# Patient Record
Sex: Female | Born: 1990
Health system: Southern US, Community
[De-identification: ages and names within clinical notes are randomized; demographics above are authoritative.]

## PROBLEM LIST (undated history)

## (undated) DIAGNOSIS — F419 Anxiety disorder, unspecified: Secondary | ICD-10-CM

## (undated) DIAGNOSIS — J45909 Unspecified asthma, uncomplicated: Secondary | ICD-10-CM

## (undated) HISTORY — DX: Unspecified asthma, uncomplicated: J45.909

## (undated) HISTORY — PX: TONSILLECTOMY: SHX5217

## (undated) HISTORY — DX: Anxiety disorder, unspecified: F41.9

---
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
## 2011-06-16 ENCOUNTER — Ambulatory Visit (INDEPENDENT_AMBULATORY_CARE_PROVIDER_SITE_OTHER): Payer: 59 | Admitting: Family Medicine

## 2011-06-16 ENCOUNTER — Encounter: Payer: Self-pay | Admitting: Family Medicine

## 2011-06-16 ENCOUNTER — Other Ambulatory Visit (HOSPITAL_COMMUNITY)
Admission: RE | Admit: 2011-06-16 | Discharge: 2011-06-16 | Disposition: A | Discharge status: Home / Self Care | Payer: 59 | Source: Ambulatory Visit | Attending: Family Medicine | Admitting: Family Medicine

## 2011-06-16 DIAGNOSIS — N938 Other specified abnormal uterine and vaginal bleeding: Secondary | ICD-10-CM

## 2011-06-16 DIAGNOSIS — J45901 Unspecified asthma with (acute) exacerbation: Secondary | ICD-10-CM

## 2011-06-16 DIAGNOSIS — N949 Unspecified condition associated with female genital organs and menstrual cycle: Secondary | ICD-10-CM

## 2011-06-16 DIAGNOSIS — IMO0001 Reserved for inherently not codable concepts without codable children: Secondary | ICD-10-CM

## 2011-06-16 DIAGNOSIS — Z23 Encounter for immunization: Secondary | ICD-10-CM

## 2011-06-16 DIAGNOSIS — J45909 Unspecified asthma, uncomplicated: Secondary | ICD-10-CM

## 2011-06-16 DIAGNOSIS — Z01419 Encounter for gynecological examination (general) (routine) without abnormal findings: Secondary | ICD-10-CM | POA: Insufficient documentation

## 2011-06-16 DIAGNOSIS — R5383 Other fatigue: Secondary | ICD-10-CM

## 2011-06-16 LAB — POCT HEMOGLOBIN: Hemoglobin: 14

## 2011-06-16 MED ORDER — ETHYNODIOL DIAC-ETH ESTRADIOL 1-50 MG-MCG PO TABS: 1.0000 | ORAL_TABLET | Freq: Every day | ORAL | Status: DC

## 2011-06-16 MED ORDER — PREDNISONE 20 MG PO TABS: ORAL_TABLET | ORAL | Status: DC

## 2011-06-16 NOTE — Patient Instructions (Signed)
Take 2  prednisone tablets daily for 3 days, then taper as outlined  Start the [facility] with you next.  Return in two weeks for follow-up

## 2011-07-01 ENCOUNTER — Encounter: Payer: Self-pay | Admitting: Family Medicine

## 2011-07-01 DIAGNOSIS — R5383 Other fatigue: Secondary | ICD-10-CM | POA: Insufficient documentation

## 2011-07-01 DIAGNOSIS — J45909 Unspecified asthma, uncomplicated: Secondary | ICD-10-CM | POA: Insufficient documentation

## 2011-07-01 DIAGNOSIS — N938 Other specified abnormal uterine and vaginal bleeding: Secondary | ICD-10-CM | POA: Insufficient documentation

## 2011-07-01 NOTE — Progress Notes (Signed)
  Subjective:    Patient ID: Erin Pratt, female    DOB: Feb 01, 1991, 20 y.o.   MRN: [id number]  HPI Erin Pratt is a 20 year old single female, nonsmoker, who comes in today for evaluation of fatigue.  She has a history of feeling tired no energy for the past couple weeks, etiology unknown.  She went recently to an urgent care for evaluation of cough and was given a Z-Pak, but it didn't help.  She's had a history of allergic rhinitis in the past.  Her periods are heavy and a lot of cramping.  Currently no birth control.  Not sexually active.  She said surgery on her elbow and shoulder previous softball injuries.  When she was a teenager.   Review of Systems  Constitutional: Positive for fatigue.  HENT: Negative.   Eyes: Negative.   Respiratory: Positive for cough.   Cardiovascular: Negative.   Gastrointestinal: Negative.   Genitourinary: Negative.   Musculoskeletal: Negative.   Neurological: Negative.   Hematological: Negative.   Psychiatric/Behavioral: Negative.        Objective:   Physical Exam  Constitutional: She appears well-developed and well-nourished.  HENT:  Head: Normocephalic and atraumatic.  Right Ear: External ear normal.  Left Ear: External ear normal.  Nose: Nose normal.  Mouth/Throat: Oropharynx is clear and moist.  Eyes: EOM are normal. Pupils are equal, round, and reactive to light.  Neck: Normal range of motion. Neck supple. No thyromegaly present.  Cardiovascular: Normal rate, regular rhythm, normal heart sounds and intact distal pulses.  Exam reveals no gallop and no friction rub.   No murmur heard. Pulmonary/Chest: Effort normal. She has wheezes.  Abdominal: Soft. Bowel sounds are normal. She exhibits no distension and no mass. There is no tenderness. There is no rebound.  Genitourinary: Vagina normal and uterus normal. No vaginal discharge found.       Bilateral breast exam normal  Musculoskeletal: Normal range of motion.  Lymphadenopathy:   She has no cervical adenopathy.  Neurological: She is alert. She has normal reflexes. No cranial nerve deficit. She exhibits normal muscle tone. Coordination normal.  Skin: Skin is warm and dry.  Psychiatric: She has a normal mood and affect. Her behavior is normal. Judgment and thought content normal.          Assessment & Plan:  Healthy female.  Dysfunctional uterine bleeding.  Start BCPs.  Asthma.  Begin prednisone burst and taper.  Return in two weeks for follow-up, sooner if any problem

## 2011-07-02 ENCOUNTER — Ambulatory Visit: Payer: 59 | Admitting: Family Medicine

## 2011-07-07 ENCOUNTER — Telehealth: Payer: Self-pay | Admitting: *Deleted

## 2011-07-07 MED ORDER — AZITHROMYCIN 250 MG PO TABS: ORAL_TABLET | ORAL | Status: AC

## 2011-07-07 NOTE — Telephone Encounter (Signed)
Patient was exposed to pertussis. Positive test.  z pack per dr Kirtland Bouchard

## 2011-07-14 ENCOUNTER — Ambulatory Visit: Payer: 59 | Admitting: Family Medicine

## 2011-07-24 ENCOUNTER — Ambulatory Visit (INDEPENDENT_AMBULATORY_CARE_PROVIDER_SITE_OTHER): Payer: 59 | Admitting: Family Medicine

## 2011-07-24 ENCOUNTER — Encounter: Payer: Self-pay | Admitting: Family Medicine

## 2011-07-24 DIAGNOSIS — J45909 Unspecified asthma, uncomplicated: Secondary | ICD-10-CM

## 2011-07-24 DIAGNOSIS — N949 Unspecified condition associated with female genital organs and menstrual cycle: Secondary | ICD-10-CM

## 2011-07-24 DIAGNOSIS — N938 Other specified abnormal uterine and vaginal bleeding: Secondary | ICD-10-CM

## 2011-07-24 NOTE — Patient Instructions (Signed)
Return in October for a complete physical examination and Pap sooner if any problems

## 2011-07-24 NOTE — Progress Notes (Signed)
  Subjective:    Patient ID: Erin Pratt, female    DOB: 1991/05/21, 20 y.o.   MRN: [id number]  HPI Erin Pratt is a 20 year old single female, nonsmoker, who comes in today for evaluation of two problems.  We saw her for a flare of her asthma and started on a short course of prednisone.  She now is symptom free.  We started her on BCPs because of dysfunction uterine bleeding.  She is taking one pack period started this week.  No side effects from medication, specifically, no breast changes.  Blood pressure, normal.   Review of Systems General and pulmonary and GYN review of systems otherwise negative    Objective:   Physical Exam  Well-built well-nourished, female, in no acute distress.  Examination lungs were perfectly normal.  No wheezing      Assessment & Plan:  Dysfunction uterine bleeding.  Continue BCPs follow-up in one year.  Asthma, resolved.  Return p.r.n.

## 2011-10-01 ENCOUNTER — Ambulatory Visit (INDEPENDENT_AMBULATORY_CARE_PROVIDER_SITE_OTHER): Payer: 59 | Admitting: Family Medicine

## 2011-10-01 ENCOUNTER — Encounter: Payer: Self-pay | Admitting: Family Medicine

## 2011-10-01 DIAGNOSIS — L509 Urticaria, unspecified: Secondary | ICD-10-CM | POA: Insufficient documentation

## 2011-10-01 MED ORDER — PREDNISONE 20 MG PO TABS: ORAL_TABLET | ORAL | Status: DC

## 2011-10-01 NOTE — Patient Instructions (Signed)
Take the prednisone as directed  Claritin 10 mg in the morning for itching  Return when necessary

## 2011-10-01 NOTE — Progress Notes (Signed)
  Subjective:    Patient ID: Erin Pratt, female    DOB: Jan 04, 1991, 21 y.o.   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 She has a history of underlying allergic rhinitis and asthma. She's never had urticaria before.  Review of diet shows no change  No history of any contact dermatitis   Review of Systems    general and dermatologic review of systems otherwise negative Objective:   Physical Exam Well-developed well-nourished female in no acute distress examination skin shows for whelps 2" x 2" right upper and right wrist and left arm.       Assessment & Plan:  Urticaria unknown etiology plan prednisone burst and taper return when necessary

## 2011-10-21 ENCOUNTER — Telehealth: Payer: Self-pay | Admitting: Family Medicine

## 2011-10-21 ENCOUNTER — Encounter: Payer: Self-pay | Admitting: Family Medicine

## 2011-10-21 ENCOUNTER — Ambulatory Visit (INDEPENDENT_AMBULATORY_CARE_PROVIDER_SITE_OTHER): Payer: 59 | Admitting: Family Medicine

## 2011-10-21 VITALS — BP 110/70 | Temp 99.0°F | Wt 149.0 lb

## 2011-10-21 DIAGNOSIS — G43919 Migraine, unspecified, intractable, without status migrainosus: Secondary | ICD-10-CM | POA: Insufficient documentation

## 2011-10-21 MED ORDER — TRAMADOL HCL 50 MG PO TABS: ORAL_TABLET | ORAL | Status: DC

## 2011-10-21 NOTE — Progress Notes (Signed)
  Subjective:    Patient ID: Erin Pratt, female    DOB: July 21, 1991, 20 y.o.   MRN: [id number]  HPI Jaydeen is a 21 year old single female nonsmoker who comes in today with a headache for 6 days  She's had episodic migraines since she was 20 years of age. Last week she developed a headache thought it was a migraine and took some Motrin and the won't go away. She states in the morning she gets up and feels fairly normal and then the headache starts rather immediately. She has some nausea no vomiting and photophobia no phonophobia. Neurologic review of systems negative. She does consume excessive amounts of caffeine  LMP last week normal. Social history she's single she's in school she is working she says her life is okay  She takes BCPs and had a normal period last week   Review of Systems General and neurologic review of systems otherwise negative    Objective:   Physical Exam Well-developed well-nourished female in acute distress HEENT negative neck was supple no adenopathy neurologic exam normal       Assessment & Plan:  Cluster migraine plan prednisone burst and taper tramadol one half to one tablet every 4-6 hours. For headache return in 2 weeks for followup DC caffeine

## 2011-10-21 NOTE — Telephone Encounter (Signed)
Pt is sch for ov today 12.45pm

## 2011-10-21 NOTE — Telephone Encounter (Signed)
Pt has been experiencing sinus headaches and pressure, drainage for 5 days and would like to be worked in today. Please advise

## 2011-10-21 NOTE — Patient Instructions (Signed)
Restart the prednisone 2 tabs x3 days, 1 tab x3 days, a half a tab x3 days, then a half a tablet Monday Wednesday Friday for a 3 week taper  Tramadol 50 mg,,,,,,,,,,,,, one half to one tablet every 4-6 hours as needed for headache  Return in 2 weeks for followup  Stay on a complete caffeine free diet

## 2011-11-06 ENCOUNTER — Ambulatory Visit: Payer: 59 | Admitting: Family Medicine

## 2012-03-24 ENCOUNTER — Telehealth: Payer: Self-pay | Admitting: Family Medicine

## 2012-03-24 ENCOUNTER — Encounter: Payer: Self-pay | Admitting: Family Medicine

## 2012-03-24 ENCOUNTER — Ambulatory Visit (INDEPENDENT_AMBULATORY_CARE_PROVIDER_SITE_OTHER): Payer: 59 | Admitting: Family Medicine

## 2012-03-24 VITALS — BP 110/70 | Temp 98.6°F | Wt 147.0 lb

## 2012-03-24 DIAGNOSIS — M549 Dorsalgia, unspecified: Secondary | ICD-10-CM | POA: Insufficient documentation

## 2012-03-24 DIAGNOSIS — G43919 Migraine, unspecified, intractable, without status migrainosus: Secondary | ICD-10-CM

## 2012-03-24 MED ORDER — TRAMADOL HCL 50 MG PO TABS: 50.0000 mg | ORAL_TABLET | Freq: Three times a day (TID) | ORAL | Status: DC | PRN

## 2012-03-24 MED ORDER — DIAZEPAM 2 MG PO TABS: 2.0000 mg | ORAL_TABLET | Freq: Four times a day (QID) | ORAL | Status: AC | PRN
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
## 2012-03-24 NOTE — Patient Instructions (Addendum)
Motrin 600 mg twice daily with food  Tramadol one half to one tablet 3 times a day as needed for severe pain  Valium 2 mg at bedtime ice packs when necessary  Return when necessary  Set up a time the first week in November for your annual exam

## 2012-03-24 NOTE — Telephone Encounter (Signed)
Appt made for today, 11:15 - pt aware.

## 2012-03-24 NOTE — Telephone Encounter (Signed)
Okay to work in

## 2012-05-22 ENCOUNTER — Other Ambulatory Visit: Payer: Self-pay | Admitting: Family Medicine

## 2012-06-08 ENCOUNTER — Other Ambulatory Visit (INDEPENDENT_AMBULATORY_CARE_PROVIDER_SITE_OTHER): Payer: BC Managed Care – PPO

## 2012-06-08 DIAGNOSIS — Z0000 Encounter for general adult medical examination without abnormal findings: Secondary | ICD-10-CM

## 2012-06-08 LAB — CBC WITH DIFFERENTIAL/PLATELET
Basophils Relative: 0.6 % (ref 0.0–3.0)
Eosinophils Absolute: 0.1 10*3/uL (ref 0.0–0.7)
Eosinophils Relative: 2.2 % (ref 0.0–5.0)
Lymphocytes Relative: 34.2 % (ref 12.0–46.0)
Monocytes Relative: 6.7 % (ref 3.0–12.0)
Neutrophils Relative %: 56.3 % (ref 43.0–77.0)
RBC: 4.19 Mil/uL (ref 3.87–5.11)
WBC: 4.7 10*3/uL (ref 4.5–10.5)

## 2012-06-08 LAB — POCT URINALYSIS DIPSTICK
Ketones, UA: NEGATIVE
Protein, UA: NEGATIVE
Spec Grav, UA: 1.02

## 2012-06-08 LAB — BASIC METABOLIC PANEL
Calcium: 9.1 mg/dL (ref 8.4–10.5)
Creatinine, Ser: 0.8 mg/dL (ref 0.4–1.2)
Sodium: 139 mEq/L (ref 135–145)

## 2012-06-08 LAB — HEPATIC FUNCTION PANEL
ALT: 10 U/L (ref 0–35)
AST: 16 U/L (ref 0–37)
Alkaline Phosphatase: 40 U/L (ref 39–117)
Bilirubin, Direct: 0.1 mg/dL (ref 0.0–0.3)
Total Protein: 6.8 g/dL (ref 6.0–8.3)

## 2012-06-08 LAB — LIPID PANEL
Cholesterol: 115 mg/dL (ref 0–200)
LDL Cholesterol: 36 mg/dL (ref 0–99)
VLDL: 10.6 mg/dL (ref 0.0–40.0)

## 2012-06-15 ENCOUNTER — Encounter: Payer: 59 | Admitting: Family Medicine

## 2012-07-05 ENCOUNTER — Other Ambulatory Visit (HOSPITAL_COMMUNITY)
Admission: RE | Admit: 2012-07-05 | Discharge: 2012-07-05 | Disposition: A | Discharge status: Home / Self Care | Payer: BC Managed Care – PPO | Source: Ambulatory Visit | Attending: Family Medicine | Admitting: Family Medicine

## 2012-07-05 ENCOUNTER — Ambulatory Visit (INDEPENDENT_AMBULATORY_CARE_PROVIDER_SITE_OTHER): Payer: 59 | Admitting: Family Medicine

## 2012-07-05 ENCOUNTER — Encounter: Payer: Self-pay | Admitting: Family Medicine

## 2012-07-05 VITALS — BP 108/68 | Temp 98.3°F | Ht 67.25 in | Wt 149.0 lb

## 2012-07-05 DIAGNOSIS — N949 Unspecified condition associated with female genital organs and menstrual cycle: Secondary | ICD-10-CM

## 2012-07-05 DIAGNOSIS — Z01419 Encounter for gynecological examination (general) (routine) without abnormal findings: Secondary | ICD-10-CM | POA: Insufficient documentation

## 2012-07-05 DIAGNOSIS — N63 Unspecified lump in unspecified breast: Secondary | ICD-10-CM

## 2012-07-05 DIAGNOSIS — N938 Other specified abnormal uterine and vaginal bleeding: Secondary | ICD-10-CM

## 2012-07-05 MED ORDER — ETHYNODIOL DIAC-ETH ESTRADIOL 1-50 MG-MCG PO TABS: ORAL_TABLET | ORAL | Status: DC

## 2012-07-05 NOTE — Patient Instructions (Addendum)
Continue the BCPs  Motrin 600 mg twice daily as needed for chest wall pain  Return to sometime in the next couple weeks for removal of the lesion on your left hip  Return in one year for general physical examination sooner if any problems  Remember to do BSE monthly

## 2012-07-05 NOTE — Progress Notes (Signed)
  Subjective:    Patient ID: Erin Pratt, female    DOB: 1991/01/21, 21 y.o.   MRN: [id number]  HPI Erin Pratt is a 21 year old single female nonsmoker who comes in today for general physical examination  She takes BCPs because of a history of dysfunction uterine bleeding  She also takes tramadol when necessary for migraine headaches  She's been having some left-sided chest wall pain for a couple weeks. 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     Review of Systems  Constitutional: Negative.   HENT: Negative.   Eyes: Negative.   Respiratory: Negative.   Cardiovascular: Negative.   Gastrointestinal: Negative.   Genitourinary: Negative.   Musculoskeletal: Negative.   Neurological: Negative.   Hematological: Negative.   Psychiatric/Behavioral: Negative.        Objective:   Physical Exam  Constitutional: She appears well-developed and well-nourished.  HENT:  Head: Normocephalic and atraumatic.  Right Ear: External ear normal.  Left Ear: External ear normal.  Nose: Nose normal.  Mouth/Throat: Oropharynx is clear and moist.  Eyes: EOM are normal. Pupils are equal, round, and reactive to light.  Neck: Normal range of motion. Neck supple. No thyromegaly present.  Cardiovascular: Normal rate, regular rhythm, normal heart sounds and intact distal pulses.  Exam reveals no gallop and no friction rub.   No murmur heard.      Deformity of the eighth ninth and 10th ribs at the sternal junction  Pulmonary/Chest: Effort normal and breath sounds normal.  Abdominal: Soft. Bowel sounds are normal. She exhibits no distension and no mass. There is no tenderness. There is no rebound.  Genitourinary: Vagina normal and uterus normal.       Bilateral breast exam normal except for a pea-sized cystic lesion right breast at the 12:00 position advised to do BSE monthly  Musculoskeletal: Normal range of  motion.  Lymphadenopathy:    She has no cervical adenopathy.  Neurological: She is alert. She has normal reflexes. No cranial nerve deficit. She exhibits normal muscle tone. Coordination normal.  Skin: Skin is warm and dry.       Total body skin exam normal except for a dark lesion left tip advised to return for removal  Psychiatric: She has a normal mood and affect. Her behavior is normal. Judgment and thought content normal.          Assessment & Plan:  Healthy female  Dysfunction uterine bleeding continue BCPs  Migraine headaches tramadol when necessary  Chest wall pain reassured  Abnormal. Lesion left tip return for removal

## 2012-07-14 ENCOUNTER — Ambulatory Visit (INDEPENDENT_AMBULATORY_CARE_PROVIDER_SITE_OTHER): Payer: BC Managed Care – PPO | Admitting: Family Medicine

## 2012-07-14 DIAGNOSIS — L989 Disorder of the skin and subcutaneous tissue, unspecified: Secondary | ICD-10-CM

## 2012-07-14 DIAGNOSIS — D235 Other benign neoplasm of skin of trunk: Secondary | ICD-10-CM

## 2012-07-19 DIAGNOSIS — D235 Other benign neoplasm of skin of trunk: Secondary | ICD-10-CM | POA: Insufficient documentation

## 2012-07-19 NOTE — Patient Instructions (Signed)
Followup in 3 months  Sunscreens SPF 50 and no more tanning booths

## 2012-07-19 NOTE — Progress Notes (Signed)
  Subjective:    Patient ID: Erin Pratt, female    DOB: 1991-01-23, 21 y.o.   MRN: [id number]  HPI Erin Pratt is a 21 year old single female nonsmoker who comes in today for removal of a lesion on her back  We noticed a black lesion on her back it measures 7 mm x7 mm it's black with symmetrical borders. She's had a history of sun exposure and tanning booths exposure.  After informed consent the lesion was cleaned with alcohol and anesthetized with 1% Xylocaine with epinephrine. It was removed with 2 mm margins the base was cauterized Band-Aids was applied. The lesion was sent for pathologic analysis. Clinically appears to be a dysplastic nevus  Path report shows a dysplastic nevus with moderate atypia clean margins. Patient informed to come back in 3 months for followup complete skin exam   Review of Systems General and dermatologic review of systems otherwise negative again advise no more tanning booths    Objective:   Physical Exam Procedures see above       Assessment & Plan:  Dysplastic nevus removed followup in 3 months

## 2012-09-07 ENCOUNTER — Encounter: Payer: Self-pay | Admitting: Family Medicine

## 2012-09-07 ENCOUNTER — Ambulatory Visit (INDEPENDENT_AMBULATORY_CARE_PROVIDER_SITE_OTHER): Payer: BC Managed Care – PPO | Admitting: Family Medicine

## 2012-09-07 VITALS — BP 102/70 | Temp 98.1°F | Wt 146.0 lb

## 2012-09-07 DIAGNOSIS — J069 Acute upper respiratory infection, unspecified: Secondary | ICD-10-CM

## 2012-09-07 MED ORDER — HYDROCODONE-HOMATROPINE 5-1.5 MG/5ML PO SYRP: 5.0000 mL | ORAL_SOLUTION | Freq: Three times a day (TID) | ORAL | Status: DC | PRN

## 2012-09-07 NOTE — Patient Instructions (Signed)
Drink lots of water  UC Nettie pot nightly with salt water  Afrin nasal spray x5 nights  Vaporizer or humidifier in her bedroom at night  Hydromet 1/2-1 teaspoon at bedtime when necessary for cough

## 2012-09-07 NOTE — Progress Notes (Signed)
  Subjective:    Patient ID: Erin Pratt, female    DOB: Aug 20, 1990, 22 y.o.   MRN: [id number]  HPI Erin Pratt is a 22 year old single female nonsmoker who comes in today with a five-day history of head congestion sore throat earache and cough. No fever no sputum production she is a nonsmoker   Review of Systems    review of systems otherwise negative Objective:   Physical Exam  Well-developed well-nourished female no acute distress HEENT were negative neck was supple no adenopathy lungs are clear      Assessment & Plan:  Viral syndrome treat symptomatically

## 2012-09-13 ENCOUNTER — Ambulatory Visit (INDEPENDENT_AMBULATORY_CARE_PROVIDER_SITE_OTHER): Payer: BC Managed Care – PPO | Admitting: Family Medicine

## 2012-09-13 ENCOUNTER — Encounter: Payer: Self-pay | Admitting: Family Medicine

## 2012-09-13 ENCOUNTER — Other Ambulatory Visit: Payer: Self-pay | Admitting: Family Medicine

## 2012-09-13 VITALS — BP 116/70 | Temp 97.8°F | Wt 146.0 lb

## 2012-09-13 DIAGNOSIS — J069 Acute upper respiratory infection, unspecified: Secondary | ICD-10-CM

## 2012-09-13 DIAGNOSIS — D235 Other benign neoplasm of skin of trunk: Secondary | ICD-10-CM

## 2012-09-13 MED ORDER — AMOXICILLIN 500 MG PO CAPS: 500.0000 mg | ORAL_CAPSULE | Freq: Three times a day (TID) | ORAL | Status: DC

## 2012-09-13 NOTE — Patient Instructions (Addendum)
Remove the Band-Aids  Within 2 weeks Fleet Contras or I will call you the report  Continue the treatment program for your viral infection however if by the weekend he is still having left facial pain add the amoxicillin

## 2012-09-13 NOTE — Progress Notes (Signed)
  Subjective:    Patient ID: Erin Pratt, female    DOB: Dec 06, 1990, 22 y.o.   MRN: [id number]  HPI Erin Pratt is a 22 year old female who comes in today for a skin check  She's had a history of dysplastic nevus in the past.  She's also had a viral infection for about a week she had some left-sided facial pain but she's been using the day prior in the afternoon that is seen to be improving.   Review of Systems Review of systems otherwise negative    Objective:   Physical Exam  Total body skin exam normal except for scars from previous lesions removed and 3 new black lesions  Number one is 8 mm x 8 mm on her right upper back  #2 is 6 mm x 6 mm left anterior thigh  #3 is 3 mm x4 mm the left of #2  After informed consent altered lesions were cleaned with alcohol and anesthetized with 1% Xylocaine with epinephrine. They were excised with 3 mm margins and sent for pathologic analysis. The bases were cauterized Band-Aids were applied she tolerated the procedure no complications      Assessment & Plan:  Viral syndrome plan continue to treat symptomatically add amoxicillin of sinus symptoms persist  Dysplastic nevi x3 removed Path pending

## 2013-01-11 ENCOUNTER — Encounter: Payer: Self-pay | Admitting: Family Medicine

## 2013-01-11 ENCOUNTER — Ambulatory Visit (INDEPENDENT_AMBULATORY_CARE_PROVIDER_SITE_OTHER): Payer: 59 | Admitting: Family Medicine

## 2013-01-11 VITALS — BP 110/70 | Temp 98.2°F | Wt 143.0 lb

## 2013-01-11 DIAGNOSIS — D235 Other benign neoplasm of skin of trunk: Secondary | ICD-10-CM

## 2013-01-11 NOTE — Progress Notes (Signed)
  Subjective:    Patient ID: Erin Pratt, female    DOB: November 27, 1990, 22 y.o.   MRN: [id number]  HPI Remi is a 22 year old single female nonsmoker who comes in today for followup skin exam  In February we removed 3 lesions all of which were dysplastic compound nevi with mild atypia. She has had significant sun exposure as a teenager. Her grandfather who is a farmer had a melanoma.   Review of Systems Review of systems otherwise negative    Objective:   Physical Exam   Well-developed well-nourished female no acute distress total body skin exam shows the 3 lesions that we removed are well-healed with clean margins no re\re pigmentation.  The rest of her freckles and moles all appear normal     Assessment & Plan:  Dysplastic nevi x3,,,,,,,, recommend monthly skin exam followup in the fall for her annual physical sooner if any problems

## 2013-01-11 NOTE — Patient Instructions (Signed)
At the same time the [facility] monthly breast exam also do a complete skin exam  Return midcycle in November for your annual physical examination  If however you see something abnormal something that's grown or changed colors then return sooner for reevaluation

## 2013-04-25 ENCOUNTER — Telehealth: Payer: Self-pay | Admitting: Family Medicine

## 2013-04-25 NOTE — Telephone Encounter (Signed)
Appointment made with Dr Artist Pais

## 2013-04-25 NOTE — Telephone Encounter (Signed)
Patient Information:  Caller Name: Efraim Kaufmann  Phone: [phone]  Patient: Erin Pratt, Erin Pratt  Gender: Female  DOB: 27-Sep-1990  Age: 22 Years  PCP: [facility])  Pregnant: No  Office Follow Up:  Does the office need to follow up with this patient?: Yes  Instructions For The Office: Tramadol not helping for cluster headaches. She is wondering if another medication can be tried. Uses [facility] in [place], [place].    Symptoms  Reason For Call & Symptoms: Calling about having Headaches several times weekly and Tramadol not helping. She has c/o blurry vision with headaches. eye exam normal. Headaches usually occur after work- 8 hours on the computer. She has clear runny nose and takes Zyrtec daily.  Reviewed Health History In EMR: Yes  Reviewed Medications In EMR: Yes  Reviewed Allergies In EMR: No  Reviewed Surgeries / Procedures: Yes  Date of Onset of Symptoms: 04/11/2013  Treatments Tried: Tylenol, Ibuprofen  Treatments Tried Worked: No OB / GYN:  LMP: 04/18/2013  Guideline(s) Used:  Headache  Disposition Per Guideline:   Callback by PCP or Subspecialist within 1 Hour  Reason For Disposition Reached:   Severe headache and has had severe headaches before  Advice Given:  Migraine Medication:   If your doctor has prescribed specific medication for your migraine, take it as directed as soon as the migraine starts.  Reassurance - Migraine Headache:  You have told me that this headache is similar to previous migraine headaches that you have had. If the pattern or severity of your headache changes, you will need to see your physician.  Reassurance - Muscle Tension Headache:  You have told me that this headache is similar to your previously diagnosed muscle tension headaches.  The majority of headaches are caused by muscle tension.  The discomfort is usually diffuse and may be described as a "tight band" around the head. It may radiate down into the neck and shoulders. The  discomfort can be aggravated by emotional stress.  This sounds like a painful headache that you are having, but there are pain medications you can take and other instructions I can give you to reduce the pain.  Migraine Medication:   If your doctor has prescribed specific medication for your migraine, take it as directed as soon as the migraine starts.  Rest:   Lie down in a dark, quiet place and try to relax. Close your eyes and imagine your entire body relaxing.  Apply Cold to the Area:   Apply a cold wet washcloth or cold pack to the forehead for 20 minutes.  Stretching:   Stretch and massage any tight neck muscles.  Call Back If:  Headache lasts longer than 24 hours  You become worse.  Patient Will Follow Care Advice:  YES

## 2013-04-26 ENCOUNTER — Ambulatory Visit (INDEPENDENT_AMBULATORY_CARE_PROVIDER_SITE_OTHER): Payer: 59 | Admitting: Internal Medicine

## 2013-04-26 ENCOUNTER — Encounter: Payer: Self-pay | Admitting: Internal Medicine

## 2013-04-26 VITALS — BP 110/70 | Temp 98.1°F | Wt 141.0 lb

## 2013-04-26 DIAGNOSIS — G43919 Migraine, unspecified, intractable, without status migrainosus: Secondary | ICD-10-CM

## 2013-04-26 DIAGNOSIS — Z23 Encounter for immunization: Secondary | ICD-10-CM

## 2013-04-26 MED ORDER — SUMATRIPTAN SUCCINATE 50 MG PO TABS: 50.0000 mg | ORAL_TABLET | Freq: Every day | ORAL | Status: DC | PRN

## 2013-04-26 MED ORDER — AMITRIPTYLINE HCL 10 MG PO TABS: 10.0000 mg | ORAL_TABLET | Freq: Every day | ORAL | Status: DC

## 2013-04-26 NOTE — Progress Notes (Signed)
  Subjective:    Patient ID: Erin Pratt, female    DOB: [birth year], 22 y.o.   MRN: [id number]  HPI  22 year old white female with history of migraine headache complains of worsening headache frequency over the last one month. She reports her usual headache exacerbations occur during seasons of spring and fall. She has had daily headaches for one month. Sometimes headaches are mild and relieved by over-the-counter ibuprofen. Other headaches are more severe and feel like migraines. Her migraine headaches are localized either to left or right. She has associated blurry vision but denies any auras. She has tried using tramadol for more severe headaches without any relief. She describes severity of headache as 7/10.  Patient is currently going to school and also working for [employer]. She works in her Astronomer.  No relation of headache exacerbations her menstrual cycles.   Review of Systems Positive for photophobia and sonophobia, Negative for nausea or vomiting  Past Medical History  Diagnosis Date  . Migraine     History   Social History  . Marital Status: Married    Spouse Name: N/A    Number of Children: N/A  . Years of Education: N/A   Occupational History  . Not on file.   Social History Main Topics  . Smoking status: Never Smoker   . Smokeless tobacco: Not on file  . Alcohol Use: No  . Drug Use: No  . Sexual Activity:    Other Topics Concern  . Not on file   Social History Narrative  . No narrative on file    Past Surgical History  Procedure Laterality Date  . Tonsillectomy    . Shoulder surgery    . Elbow surgery      Family History  Problem Relation Age of Onset  . Asthma Mother   . Hypertension Mother   . Arthritis Mother   . Asthma Father   . Arthritis Father   . Asthma Sister   . Asthma Brother     Not on File  No current outpatient prescriptions on file prior to visit.   No current facility-administered medications on  file prior to visit.    BP 110/70  Temp(Src) 98.1 F (36.7 C) (Oral)  Wt 141 lb (63.957 kg)  BMI 21.92 kg/m2       Objective:   Physical Exam  Constitutional: She is oriented to person, place, and time. She appears well-developed and well-nourished.  HENT:  Head: Normocephalic and atraumatic.  Right Ear: External ear normal.  Left Ear: External ear normal.  Mouth/Throat: Oropharynx is clear and moist.  Eyes: Conjunctivae and EOM are normal. Pupils are equal, round, and reactive to light.  No defects in peripheral vision  Neck: Neck supple.  Cardiovascular: Normal rate, regular rhythm and normal heart sounds.   No murmur heard. Pulmonary/Chest: Effort normal and breath sounds normal. She has no wheezes.  Lymphadenopathy:    She has no cervical adenopathy.  Neurological: She is alert and oriented to person, place, and time. She displays normal reflexes. No cranial nerve deficit. She exhibits normal muscle tone.  Psychiatric: She has a normal mood and affect. Her behavior is normal.          Assessment & Plan:

## 2013-04-26 NOTE — Assessment & Plan Note (Addendum)
Patient experiencing frequent migraines. Start prophylactic agent-amitriptyline 10 mg. Continue to use ibuprofen 400-600 mg as needed for mild headaches. Use Imitrex 50 mg for more severe headaches.  Patient understands amitriptyline is not safe to take if she were to become pregnant.

## 2013-04-26 NOTE — Patient Instructions (Signed)
Please contact our office if your symptoms do not improve or gets worse.  

## 2013-05-06 ENCOUNTER — Telehealth: Payer: Self-pay | Admitting: Family Medicine

## 2013-05-06 NOTE — Telephone Encounter (Signed)
Patient Information:  Caller Name: Efraim Kaufmann  Phone: [phone]  Patient: Erin Pratt, Erin Pratt  Gender: Female  DOB: 14-Mar-1991  Age: 22 Years  PCP: Kelle Darting [facility])  Pregnant: No  Office Follow Up:  Does the office need to follow up with this patient?: Yes  Instructions For The Office: Please contact mother with guidance . Advised to take second Imitrex. Child states pain has worsened today. ...  RN Note:  ADvised to take the second of Imitrex, 2 hour of intital dose. If the headache pain is not better and continues to worsen , advised to contact the office . Please advise Dr. Joellyn Haff /Dr.Yoo to update on status guidance.  Symptoms  Reason For Call & Symptoms: Mother is calling concerning her daughter. She is having "terrible headachs again"  ongoing for a month. She has been having them and has seen Dr. Tawanna Cooler and Dr. Artist Pais . Last appt was 04/26/13.  She was given Amitriptylline, Ibuprofen for mild pain  and Imitrex for severe pain. Mother is not with child and cannot answer questions for triage. Patient is at work.  Mother asked me to call back on her home phone and she will text child. [phone].   Child did take Amitrptylline last night and has taken the Imitrex 1 hour ago. Advised to repeat medication 2 hours after initial dose. . Sinus drainage yesterday and today 05/05/13.  Reviewed Health History In EMR: Yes  Reviewed Medications In EMR: Yes  Reviewed Allergies In EMR: Yes  Reviewed Surgeries / Procedures: Yes  Date of Onset of Symptoms: 04/01/2013  Treatments Tried: Imitrex, ibuprofen and amtiiptylline  Treatments Tried Worked: No OB / GYN:  LMP: 04/22/2013  Guideline(s) Used:  Headache  Disposition Per Guideline:   Callback by PCP or Subspecialist within 1 Hour  Reason For Disposition Reached:   Severe headache and has had severe headaches before  Advice Given:  Reassurance - Migraine Headache:  You have told me that this headache is similar to previous migraine  headaches that you have had. If the pattern or severity of your headache changes, you will need to see your physician.  Migraine headaches are also called vascular headaches. A migraine can be anywhere from mild to severely painful. Sufferers often describe it as throbbing or pulsing. It is often just on one side. Associated symptoms include nausea and vomiting. Some individuals will have visual or other neurological warning symptoms (aura) that a migraine is coming.  This sounds like a painful headache that you are having, but there are pain medications you can take and other instructions I can give you to reduce the pain.  Pain Medicines:  For pain relief, you can take either acetaminophen, ibuprofen, or naproxen.  They are over-the-counter (OTC) pain drugs. You can buy them at the drugstore.  Ibuprofen (e.g., Motrin, Advil):  Take 400 mg (two 200 mg pills) by mouth every 6 hours.  Rest:   Lie down in a dark, quiet place and try to relax. Close your eyes and imagine your entire body relaxing.  Apply Cold to the Area:   Apply a cold wet washcloth or cold pack to the forehead for 20 minutes.  Stretching:   Stretch and massage any tight neck muscles.  Call Back If:  Headache lasts longer than 24 hours  You become worse.  Patient Will Follow Care Advice:  YES

## 2013-07-14 ENCOUNTER — Encounter: Payer: 59 | Admitting: Family Medicine

## 2013-07-18 ENCOUNTER — Encounter: Payer: Self-pay | Admitting: Family Medicine

## 2013-07-18 ENCOUNTER — Ambulatory Visit (INDEPENDENT_AMBULATORY_CARE_PROVIDER_SITE_OTHER): Payer: 59 | Admitting: Family Medicine

## 2013-07-18 VITALS — BP 110/70 | Temp 98.3°F | Ht 66.5 in | Wt 142.0 lb

## 2013-07-18 DIAGNOSIS — G43919 Migraine, unspecified, intractable, without status migrainosus: Secondary | ICD-10-CM

## 2013-07-18 DIAGNOSIS — N949 Unspecified condition associated with female genital organs and menstrual cycle: Secondary | ICD-10-CM

## 2013-07-18 DIAGNOSIS — N938 Other specified abnormal uterine and vaginal bleeding: Secondary | ICD-10-CM

## 2013-07-18 MED ORDER — AMITRIPTYLINE HCL 10 MG PO TABS: 10.0000 mg | ORAL_TABLET | Freq: Every day | ORAL | Status: DC

## 2013-07-18 MED ORDER — RIZATRIPTAN BENZOATE 5 MG PO TBDP: 5.0000 mg | ORAL_TABLET | ORAL | Status: DC | PRN

## 2013-07-18 MED ORDER — ETHYNODIOL DIAC-ETH ESTRADIOL 1-35 MG-MCG PO TABS: 1.0000 | ORAL_TABLET | Freq: Every day | ORAL | Status: DC

## 2013-07-18 NOTE — Progress Notes (Signed)
Pre visit review using our clinic review tool, if applicable. No additional management support is needed unless otherwise documented below in the visit note. 

## 2013-07-18 NOTE — Patient Instructions (Signed)
Elavil 10 mg,,,,,,,,, one tablet at bedtime Maxalt 5 mg...............Marland Kitchen 1 stat at the onset of a headache  BCPs uses directed,,,,,, return 6 months for followup,

## 2013-07-18 NOTE — Progress Notes (Signed)
   Subjective:    Patient ID: Erin Pratt, female    DOB: 11-23-1990, 22 y.o.   MRN: [id number]  HPI Erin Pratt is a 22 year old single female,,,,,,,,,,, do to be married in March 28........ who comes in today for general physical examination  She has migraine headaches has been using Imitrex but and not much help. We recently gave her some Elavil 10 mg each bedtime but she's not been taking that.  Her period started on Friday and she would like to get on birth control she's currently not sexually active. Her only objection to [facility] we gave her a rash on the upper part of her lip. We discussed other options including IUD use NuvaRing etc. but she like to just do something to control her periods get through the wedding and then think about it  We removed a dysplastic nevus from her back were also done do a complete skin exam   Review of Systems Review of systems otherwise negative    Objective:   Physical Exam  Well-developed and nourished female no acute distress HEENT negative neck was supple no adenopathy thyroid normal lungs are clear cardiac exam normal breast exam normal abdominal exam normal pelvic deferred to she's currently having her period and we did a Pap a year ago was normal  Extremities normal skin normal      Assessment & Plan:  Healthy female  Dysfunction uterine bleeding start BCPs  Migraine headaches Elavil 10 each bedtime Maxalt when necessary followup in 6 months

## 2013-08-10 ENCOUNTER — Telehealth: Payer: Self-pay | Admitting: *Deleted

## 2013-08-10 NOTE — Telephone Encounter (Signed)
Patient is calling because since she has started her BCP she has had a period every other week.  Her acne is also coming back.  She would like to know if her BCP can be switched and if she can get a Rx for acne?

## 2013-08-10 NOTE — Telephone Encounter (Signed)
Erin Pratt we will need to see her next week so we can sit down and discuss what her options are

## 2013-08-10 NOTE — Telephone Encounter (Signed)
Left detailed message on machine for patient to call back and schedule an appointment

## 2013-08-18 ENCOUNTER — Encounter: Payer: Self-pay | Admitting: Family Medicine

## 2013-08-18 ENCOUNTER — Ambulatory Visit: Payer: 59 | Admitting: Family Medicine

## 2013-08-18 ENCOUNTER — Ambulatory Visit (INDEPENDENT_AMBULATORY_CARE_PROVIDER_SITE_OTHER): Payer: 59 | Admitting: Family Medicine

## 2013-08-18 VITALS — BP 110/70 | Temp 98.3°F | Wt 149.0 lb

## 2013-08-18 DIAGNOSIS — N938 Other specified abnormal uterine and vaginal bleeding: Secondary | ICD-10-CM

## 2013-08-18 DIAGNOSIS — N949 Unspecified condition associated with female genital organs and menstrual cycle: Secondary | ICD-10-CM

## 2013-08-18 MED ORDER — AMOXICILLIN 500 MG PO CAPS: ORAL_CAPSULE | ORAL | Status: DC

## 2013-08-18 NOTE — Patient Instructions (Signed)
Continue current medication  Amoxicillin 500 mg twice daily when necessary for acne  Return in June sooner if any problems

## 2013-08-18 NOTE — Progress Notes (Signed)
   Subjective:    Patient ID: Erin Pratt, female    DOB: Jan 19, 1991, 23 y.o.   MRN: [id number]  HPI Ruqaya is a 23 year old single female nonsmoker who comes in today for followup having been started on BCPs a month ago for dysfunctional uterine bleeding.  She's having no side effects and birth control pills except for midcycle spotting. And she's also had some mild acne which she is treating with over-the-counter medication.   Review of Systems Review of systems negative her wedding date is March 28    Objective:   Physical Exam  Well-developed well-nourished female in no acute distress vital signs stable she is afebrile      Assessment & Plan:  Dysfunction uterine bleeding continue BCPs  Mild acne amoxicillin when necessary

## 2013-08-18 NOTE — Progress Notes (Signed)
Pre visit review using our clinic review tool, if applicable. No additional management support is needed unless otherwise documented below in the visit note. 

## 2013-08-22 ENCOUNTER — Ambulatory Visit: Payer: 59 | Admitting: Family Medicine

## 2013-08-31 ENCOUNTER — Ambulatory Visit (INDEPENDENT_AMBULATORY_CARE_PROVIDER_SITE_OTHER): Payer: 59 | Admitting: Family

## 2013-08-31 ENCOUNTER — Encounter: Payer: Self-pay | Admitting: Family

## 2013-08-31 VITALS — BP 96/62 | HR 100 | Temp 100.7°F | Wt 143.0 lb

## 2013-08-31 DIAGNOSIS — R509 Fever, unspecified: Secondary | ICD-10-CM

## 2013-08-31 DIAGNOSIS — R5381 Other malaise: Secondary | ICD-10-CM

## 2013-08-31 DIAGNOSIS — R5383 Other fatigue: Secondary | ICD-10-CM

## 2013-08-31 DIAGNOSIS — J111 Influenza due to unidentified influenza virus with other respiratory manifestations: Secondary | ICD-10-CM

## 2013-08-31 DIAGNOSIS — Z20828 Contact with and (suspected) exposure to other viral communicable diseases: Secondary | ICD-10-CM

## 2013-08-31 MED ORDER — OSELTAMIVIR PHOSPHATE 75 MG PO CAPS: 75.0000 mg | ORAL_CAPSULE | Freq: Two times a day (BID) | ORAL | Status: DC

## 2013-08-31 NOTE — Progress Notes (Signed)
Pre visit review using our clinic review tool, if applicable. No additional management support is needed unless otherwise documented below in the visit note. 

## 2013-08-31 NOTE — Progress Notes (Signed)
Subjective:    Patient ID: Erin Pratt, female    DOB: 1991/04/29, 23 y.o.   MRN: [id number]  HPI  23 year old white female, nonsmoker, patient of Dr. Sherren Mocha today with complaints of fever, cough, congestion, muscle aches and pain x2 days of worsening. She can take over-the-counter, cold and sinus without much relief. Reports been exposed to influenza but her sister.  Review of Systems  Constitutional: Positive for fever and fatigue.  HENT: Positive for congestion, rhinorrhea and sneezing.   Respiratory: Negative.   Cardiovascular: Negative.   Musculoskeletal: Positive for myalgias.  Skin: Negative.   Allergic/Immunologic: Negative.   Neurological: Negative.   Psychiatric/Behavioral: Negative.    Past Medical History  Diagnosis Date  . Migraine     History   Social History  . Marital Status: Married    Spouse Name: N/A    Number of Children: N/A  . Years of Education: N/A   Occupational History  . Not on file.   Social History Main Topics  . Smoking status: Never Smoker   . Smokeless tobacco: Not on file  . Alcohol Use: No  . Drug Use: No  . Sexual Activity:    Other Topics Concern  . Not on file   Social History Narrative  . No narrative on file    Past Surgical History  Procedure Laterality Date  . Tonsillectomy    . Shoulder surgery    . Elbow surgery      Family History  Problem Relation Age of Onset  . Asthma Mother   . Hypertension Mother   . Arthritis Mother   . Asthma Father   . Arthritis Father   . Asthma Sister   . Asthma Brother     Not on File  Current Outpatient Prescriptions on File Prior to Visit  Medication Sig Dispense Refill  . amitriptyline (ELAVIL) 10 MG tablet Take 1 tablet (10 mg total) by mouth at bedtime.  30 tablet  3  . ethynodiol-ethinyl estradiol (KELNOR,ZOVIA) 1-35 MG-MCG tablet Take 1 tablet by mouth daily.  3 Package  3  . rizatriptan (MAXALT-MLT) 5 MG disintegrating tablet Take 1 tablet (5 mg total) by mouth  as needed for migraine. May repeat in 2 hours if needed  10 tablet  0  . amoxicillin (AMOXIL) 500 MG capsule 1 by mouth twice a day when necessary for acne  30 capsule  2   No current facility-administered medications on file prior to visit.    BP 96/62  Pulse 100  Temp(Src) 100.7 F (38.2 C) (Oral)  Wt 143 lb (64.864 kg)  LMP 01/06/2015chart      Objective:   Physical Exam  Constitutional: She is oriented to person, place, and time. She appears well-developed and well-nourished.  HENT:  Right Ear: External ear normal.  Left Ear: External ear normal.  Nose: Nose normal.  Mouth/Throat: Oropharynx is clear and moist.  Neck: Normal range of motion. Neck supple.  Cardiovascular: Normal rate, regular rhythm and normal heart sounds.   Pulmonary/Chest: Effort normal and breath sounds normal.  Abdominal: Soft. Bowel sounds are normal.  Musculoskeletal: Normal range of motion.  Neurological: She is alert and oriented to person, place, and time.  Skin: Skin is warm and dry.  Psychiatric: She has a normal mood and affect.          Assessment & Plan:   1. Influenza- offer Tamiflu as an option and discussed risk versus benefit. 2. Fatigue-drink plenty of fluids. Rest. 3. Fever-alternate,  Tylenol and ibuprofen as needed

## 2013-08-31 NOTE — Patient Instructions (Signed)

## 2013-08-31 NOTE — Addendum Note (Signed)
Addended byRoxy Cedar B on: 08/31/2013 11:45 AM   Modules accepted: Orders

## 2013-09-07 ENCOUNTER — Telehealth: Payer: Self-pay | Admitting: *Deleted

## 2013-09-07 MED ORDER — ETHYNODIOL DIAC-ETH ESTRADIOL 1-50 MG-MCG PO TABS: 1.0000 | ORAL_TABLET | Freq: Every day | ORAL | Status: DC

## 2013-09-07 NOTE — Telephone Encounter (Signed)
Patient is calling because she continues to have a period.  The bleeding is light for a week and heavy the next week.  Patient would does not want to come in for an office visit but would like to know if her BCP Rx can be changed?

## 2013-09-22 ENCOUNTER — Encounter: Payer: Self-pay | Admitting: Family Medicine

## 2013-09-22 ENCOUNTER — Ambulatory Visit (INDEPENDENT_AMBULATORY_CARE_PROVIDER_SITE_OTHER): Payer: 59 | Admitting: Family Medicine

## 2013-09-22 VITALS — BP 120/80 | Temp 97.9°F | Wt 143.0 lb

## 2013-09-22 DIAGNOSIS — A088 Other specified intestinal infections: Secondary | ICD-10-CM

## 2013-09-22 DIAGNOSIS — J069 Acute upper respiratory infection, unspecified: Secondary | ICD-10-CM

## 2013-09-22 DIAGNOSIS — B9789 Other viral agents as the cause of diseases classified elsewhere: Principal | ICD-10-CM

## 2013-09-22 DIAGNOSIS — A084 Viral intestinal infection, unspecified: Secondary | ICD-10-CM | POA: Insufficient documentation

## 2013-09-22 MED ORDER — HYDROCODONE-HOMATROPINE 5-1.5 MG/5ML PO SYRP: 5.0000 mL | ORAL_SOLUTION | Freq: Three times a day (TID) | ORAL | Status: DC | PRN

## 2013-09-22 NOTE — Progress Notes (Signed)
Pre visit review using our clinic review tool, if applicable. No additional management support is needed unless otherwise documented below in the visit note. 

## 2013-09-22 NOTE — Progress Notes (Signed)
   Subjective:    Patient ID: Erin Pratt, female    DOB: 02/12/1991, 23 y.o.   MRN: [id number]  HPI The tourniquet is a 23 year old single nonsmoking female who comes in today with a week's history of head congestion sore throat left earache and cough and a 23-hour 48-hour history of nausea vomiting and diarrhea.  No fever only one episode of vomiting today   Review of Systems    review of systems negative Objective:   Physical Exam Well-developed well-nourished female no acute distress vital signs stable she's afebrile HEENT negative neck was supple no adenopathy lungs are clear       Assessment & Plan:  Viral URI with cough  Viral gastroenteritis  Treat both symptomatically

## 2013-09-22 NOTE — Patient Instructions (Signed)
Drink lots of liquids  Hydromet 1/2-1 teaspoon at bedtime when necessary  Because of vomiting and diarrhea stay on clear liquids for a day or 2 until the vomiting and diarrhea have abated

## 2013-11-06 ENCOUNTER — Other Ambulatory Visit: Payer: Self-pay | Admitting: Family Medicine

## 2013-12-27 ENCOUNTER — Ambulatory Visit: Payer: 59 | Admitting: Family Medicine

## 2014-01-07 ENCOUNTER — Other Ambulatory Visit: Payer: Self-pay | Admitting: Family Medicine

## 2014-01-16 ENCOUNTER — Ambulatory Visit: Payer: 59 | Admitting: Family Medicine

## 2014-01-17 ENCOUNTER — Encounter: Payer: Self-pay | Admitting: Family Medicine

## 2014-01-17 ENCOUNTER — Ambulatory Visit (INDEPENDENT_AMBULATORY_CARE_PROVIDER_SITE_OTHER): Payer: BC Managed Care – PPO | Admitting: Family Medicine

## 2014-01-17 VITALS — BP 102/68 | Temp 98.3°F | Wt 154.0 lb

## 2014-01-17 DIAGNOSIS — N938 Other specified abnormal uterine and vaginal bleeding: Secondary | ICD-10-CM

## 2014-01-17 DIAGNOSIS — N949 Unspecified condition associated with female genital organs and menstrual cycle: Secondary | ICD-10-CM

## 2014-01-17 DIAGNOSIS — G43919 Migraine, unspecified, intractable, without status migrainosus: Secondary | ICD-10-CM

## 2014-01-17 MED ORDER — ETHYNODIOL DIAC-ETH ESTRADIOL 1-50 MG-MCG PO TABS: 1.0000 | ORAL_TABLET | Freq: Every day | ORAL | Status: DC

## 2014-01-17 MED ORDER — RIZATRIPTAN BENZOATE 5 MG PO TBDP: 5.0000 mg | ORAL_TABLET | ORAL | Status: DC | PRN

## 2014-01-17 MED ORDER — AMITRIPTYLINE HCL 10 MG PO TABS: ORAL_TABLET | ORAL | Status: DC

## 2014-01-17 NOTE — Progress Notes (Signed)
   Subjective:    Patient ID: Erin Pratt, female    DOB: 05-02-1991, 23 y.o.   MRN: [id number]  HPI Erin Pratt is a 23 year old recently married female who comes in today for followup  We started her on Elavil 10 mg nightly at bedtime and Maxalt when necessary for migraine headaches. This is seen to help. She wishes to continue those medications  She is on BCPs doing well no complications from the medication   Review of Systems    review of systems otherwise negative Objective:   Physical Exam  Well-developed well-nourished female no acute distress vital signs stable she is afebrile      Assessment & Plan:  Migraine headaches under good control with Elavil and Maxalt when necessary continue current therapy  Dysfunction uterine bleeding continue BCPs

## 2014-01-17 NOTE — Progress Notes (Signed)
Pre visit review using our clinic review tool, if applicable. No additional management support is needed unless otherwise documented below in the visit note. 

## 2014-01-17 NOTE — Patient Instructions (Signed)
Continue current medication  Followup in the fall for your annual examination sooner if any problems

## 2014-02-07 ENCOUNTER — Ambulatory Visit (INDEPENDENT_AMBULATORY_CARE_PROVIDER_SITE_OTHER): Payer: BC Managed Care – PPO | Admitting: Family Medicine

## 2014-02-07 ENCOUNTER — Encounter: Payer: Self-pay | Admitting: Family Medicine

## 2014-02-07 VITALS — BP 118/80 | Temp 97.9°F | Wt 151.2 lb

## 2014-02-07 DIAGNOSIS — B9789 Other viral agents as the cause of diseases classified elsewhere: Principal | ICD-10-CM

## 2014-02-07 DIAGNOSIS — J069 Acute upper respiratory infection, unspecified: Secondary | ICD-10-CM

## 2014-02-07 MED ORDER — HYDROCODONE-HOMATROPINE 5-1.5 MG/5ML PO SYRP: ORAL_SOLUTION | ORAL | Status: DC

## 2014-02-07 NOTE — Progress Notes (Signed)
   Subjective:    Patient ID: Erin Pratt, female    DOB: [birth year], 23 y.o.   MRN: [id number]  HPI Ota is a 23 year old recently married female nonsmoker who comes in with a four-day history of head congestion sore throat and cough. No fever. She's going on a mission trip to the [place] on Saturday   Review of Systems Review of systems otherwise negative well-developed well-developed well-nourished well well-developed well-nourished female    Objective:   Physical Exam  Well-developed well-nourished female no acute distress vital signs stable she is afebrile HEENT negative neck was supple chest no adenopathy lungs are clear      Assessment & Plan:  Viral syndrome plan treat symptomatically Tylenol lots of liquids Hydromet cough syrup

## 2014-02-07 NOTE — Progress Notes (Signed)
Pre visit review using our clinic review tool, if applicable. No additional management support is needed unless otherwise documented below in the visit note. 

## 2014-02-07 NOTE — Patient Instructions (Signed)
Drink lots of liquids  Stop the Afrin  Take Afrin and chewing gum for your plane flight  Hydromet..... 1/2-1 teaspoon at bedtime.

## 2014-03-28 ENCOUNTER — Encounter: Payer: Self-pay | Admitting: *Deleted

## 2014-03-29 ENCOUNTER — Encounter: Payer: Self-pay | Admitting: Neurology

## 2014-03-29 ENCOUNTER — Ambulatory Visit (INDEPENDENT_AMBULATORY_CARE_PROVIDER_SITE_OTHER): Payer: 59 | Admitting: *Deleted

## 2014-03-29 ENCOUNTER — Ambulatory Visit (INDEPENDENT_AMBULATORY_CARE_PROVIDER_SITE_OTHER): Payer: 59 | Admitting: Neurology

## 2014-03-29 VITALS — BP 120/77 | HR 91 | Ht 67.0 in | Wt 153.4 lb

## 2014-03-29 DIAGNOSIS — G43009 Migraine without aura, not intractable, without status migrainosus: Secondary | ICD-10-CM

## 2014-03-29 DIAGNOSIS — G43919 Migraine, unspecified, intractable, without status migrainosus: Secondary | ICD-10-CM

## 2014-03-29 MED ORDER — AMITRIPTYLINE HCL 10 MG PO TABS: 10.0000 mg | ORAL_TABLET | Freq: Every day | ORAL | Status: DC

## 2014-03-29 MED ORDER — VALPROATE SODIUM 500 MG/5ML IV SOLN
1000.0000 mg | INTRAVENOUS | Status: DC
  Administered 2014-03-29: 1000 mg via INTRAVENOUS

## 2014-03-29 NOTE — Patient Instructions (Signed)
Pt to check out.  Does have to go back to work.  Will start elavil tonight.  Will help her to sleep.

## 2014-03-29 NOTE — Progress Notes (Signed)
Pt here for appt.  Depacon 500mg  IV ordered and may repeat x 1 if needed.  Pt at level 5-6 (has had headaches (waxes and wanes for the last 7- 10 days).  Went to ED last Wednesday.  Not pregnant, or have any allergies.  At 1331 IV 24g angiocath inserted to R AC with good blood return, taped securely.  At 1324 0.9% NS 123ml / Depacon 500mg  IV started.  Lights dimmed, and pt reclining in chair.  Refused water and blanket.  At 1333 note slight pressure relief.  At 1338 Level 4, another Depacon 500mg  / 0.9% NS 142ml started.  At 1348 Level 4.  NS flush applied, 1354 Level 4.  VSS 106/71, HR 84 RR 16, pulse ox 98.  Dr. Janann Colonel consulted.   No other meds, other then oral elavil pt to start.  Will be going back to work after this.  1404 IV discontinued.   Maintained at Level 4.  Mother at her side.   NAD.  To check out.

## 2014-03-29 NOTE — Patient Instructions (Signed)
Overall you are doing fairly well but I do want to suggest a few things today:   Remember to drink plenty of fluid, eat healthy meals and do not skip any meals. Try to eat protein with a every meal and eat a healthy snack such as fruit or nuts in between meals. Try to keep a regular sleep-wake schedule and try to exercise daily, particularly in the form of walking, 20-30 minutes a day, if you can.   As far as your medications are concerned, I would like to suggest the following: 1)Please stop the topamax 2)Start Elavil 10mg  nightly. Please call me in 2 weeks to update me on how your are responding to this medication.  Please discuss an alternative birth control pill with your ob-gyn. Some people respond better to agents with just progestin.   Please call us with any interim questions, concerns, problems, updates or refill requests.   My clinical assistant and will answer any of your questions and relay your messages to me and also relay most of my messages to you.   Our phone number is [phone]. We also have an after hours call service for urgent matters and there is a physician on-call for urgent questions. For any emergencies you know to call 911 or go to the nearest emergency room

## 2014-03-29 NOTE — Progress Notes (Signed)
[facility]    Provider:  Dr Janann Colonel Referring Provider: Dorena Cookey, MD Primary Care Physician:  Joycelyn Man, MD  CC:  headachce  HPI:  Erin Pratt is a 23 y.o. female here as a referral from Dr. Sherren Mocha for headache evaluation  Headaches started in her late teens. They are typically worse when she is on her menstrual cycle. Typically will have 2-3 during her cycle and then one to two per month. Mainly frontal and left temporal. Described as a pounding pressure type pain. Can get up to a 9/10 in severity. Will typically last hours to days. + Nausea and emesis. + Photo and phonophobia. + Blurry vision and tunnel vision. NO aura. No focal motor or sensory changes. No known triggers. Drinks 2-3 cans of soda a day. No red wine, minimal processed foods. Started topamax this past week, currently taking 50mg  nightly. Also recently started Imitrex, has tried it twice with no benefit from this. Went to ED and was given phenergan with some relief. Recently tried prednisone taper with no relief. Headache is currently at a 6/10, has had this headache for around 1 week.   Family history of migraines.   Had recent head CT which was normal.   Review of Systems: Out of a complete 14 system review, the patient complains of only the following symptoms, and all other reviewed systems are negative. + headache, fatigue, blurred vision, dizziness  History   Social History  . Marital Status: Married    Spouse Name: N/A    Number of Children: 0  . Years of Education: BA   Occupational History  . asst office manager    Social History Main Topics  . Smoking status: Never Smoker   . Smokeless tobacco: Never Used  . Alcohol Use: No  . Drug Use: No  . Sexual Activity: Not on file   Other Topics Concern  . Not on file   Social History Narrative  . No narrative on file    Family History  Problem Relation Age of Onset  . Asthma Mother   . Hypertension Mother   .  Arthritis Mother   . Asthma Father   . Asthma Sister   . Asthma Brother   . Arthritis Maternal Grandmother     RA  . Hypertension Maternal Grandmother   . Cancer - Lung Maternal Grandfather     Past Medical History  Diagnosis Date  . Migraine   . Asthma     Past Surgical History  Procedure Laterality Date  . Tonsillectomy    . Shoulder surgery Right 2009  . Elbow surgery Right 2008    Current Outpatient Prescriptions  Medication Sig Dispense Refill  . ethynodiol-ethinyl estradiol (ZOVIA 1/50E, 28,) 1-50 MG-MCG tablet Take 1 tablet by mouth daily.  3 Package  3  . SUMAtriptan (IMITREX) 100 MG tablet Take 100 mg by mouth 2 (two) times daily as needed.      . topiramate (TOPAMAX) 50 MG tablet Take 50 mg by mouth at bedtime.       No current facility-administered medications for this visit.    Allergies as of 03/29/2014  . (No Known Allergies)    Vitals: BP 120/77  Pulse 91  Ht 5\' 7"  (1.702 m)  Wt 153 lb 6.4 oz (69.582 kg)  BMI 24.02 kg/m2 Last Weight:  Wt Readings from Last 1 Encounters:  03/29/14 153 lb 6.4 oz (69.582 kg)   Last Height:   Ht Readings from Last 1 Encounters:  03/29/14 5\' 7"  (1.702 m)     Physical exam: Exam: Gen: NAD, conversant Eyes: anicteric sclerae, moist conjunctivae HENT: Atraumatic, oropharynx clear Neck: Trachea midline; supple,  Lungs: CTA, no wheezing, rales, rhonic                          CV: RRR, no MRG Abdomen: Soft, non-tender;  Extremities: No peripheral edema  Skin: Normal temperature, no rash,  Psych: Appropriate affect, pleasant  Neuro: MS: AA&Ox3, appropriately interactive, normal affect   Attention: WORLD backwards  Speech: fluent w/o paraphasic error  Memory: good recent and remote recall  CN: PERRL, VFF to FC bilat, fundoscopic exam wnl bilat, EOMI no nystagmus, no ptosis, sensation intact to LT V1-V3 bilat, face symmetric, no weakness, hearing grossly intact, palate elevates symmetrically, shoulder shrug  5/5 bilat,  tongue protrudes midline, no fasiculations noted.  Motor: normal bulk and tone Strength: 5/5  In all extremities  Coord: rapid alternating and point-to-point (FNF, HTS) movements intact.  Reflexes: symmetrical, bilat downgoing toes  Sens: LT intact in all extremities  Gait: posture, stance, stride and arm-swing normal. Tandem gait intact. Able to walk on heels and toes. Romberg absent.   Assessment:  After physical and neurologic examination, review of laboratory studies, imaging, neurophysiology testing and pre-existing records, assessment will be reviewed on the problem list.  Plan:  Treatment plan and additional workup will be reviewed under Problem List.  1)Headache: consistent with migraine without aura  23y/o woman presenting for initial evaluation of headache which based on description is most consistent with a diagnosis of migraine without aura. She may also have a component of catamenial migraine too. Will administer depacon infusion today. Will d/c Topamax as she does not like the side effects and start Elavil 10mg  nightly. Continue Imitrex for now, if no benefit can try alternative triptan. Counseled patient to discuss trying an alternative OCP with her ob-gyn. In the future may consider trying prophylaxis with either NSAID or long acting triptan prior to the start of her menstural cycle. Head CT and neuro exam normal so will hold off on MRI for now, can consider in the future if no improvement.   Jim Like, DO  [facility] [address] [place],  [address]  Phone [phone] Fax [phone]

## 2014-07-12 ENCOUNTER — Telehealth: Payer: Self-pay | Admitting: Family Medicine

## 2014-07-12 NOTE — Telephone Encounter (Signed)
Pt would like to know if the visit for he mole removal will be coded as an OV? Pt states it needs to be coded as an office visit in order for insurance to pay. pls advise.

## 2014-07-13 NOTE — Telephone Encounter (Signed)
Patient is aware and will go to the derm otologist in [facility]

## 2014-07-20 ENCOUNTER — Encounter: Payer: BC Managed Care – PPO | Admitting: Family Medicine

## 2014-07-25 ENCOUNTER — Ambulatory Visit: Payer: BC Managed Care – PPO | Admitting: Family Medicine

## 2014-11-22 ENCOUNTER — Telehealth: Payer: Self-pay | Admitting: Neurology

## 2014-11-22 ENCOUNTER — Telehealth: Payer: Self-pay | Admitting: *Deleted

## 2014-11-22 NOTE — Telephone Encounter (Signed)
Sounds like a good plan, thanks.

## 2014-11-22 NOTE — Telephone Encounter (Signed)
WID-Patient states she's had a migraine for the last three days.  Patient questioning if she could come in for IV cocktail.  Informed patient she would need to schedule appt with another provider, haven't been seen since 03/2014.  Please call and advise.

## 2014-11-22 NOTE — Telephone Encounter (Signed)
I'm happy to see her. I will ask Erin Pratt to call her and give her the option for coming in tomorrow for a migraine cocktail or an appointment but can't do both in one day. She could come for a cocktail tomorrow and see me in the office Friday. thanks

## 2014-11-22 NOTE — Telephone Encounter (Signed)
Called and spoke with husband and asked him to have wife come in the morning for 900 am migraine cocktail and to be here by 8:30/8:45am. Husband verbalized understanding. I also made appointment for her with Dr. Jaynee Eagles on Friday at 8:00am.

## 2014-11-22 NOTE — Telephone Encounter (Signed)
Thank you :)

## 2014-11-22 NOTE — Telephone Encounter (Signed)
I wonder, if patient could see you as her new provider, I do not see that she was scheduled with anyone and since she has a history of migraines, would you be willing to see her? Has not been seen in 8 months at the office.

## 2014-11-22 NOTE — Telephone Encounter (Signed)
Left a detailed message for patient to let her know we could fit her in tomorrow for a migraine cocktail and set up an appointment with her on Friday with Dr. Jaynee Eagles. Advised her to call back. Gave [facility] phone number and office hours.

## 2014-11-24 ENCOUNTER — Ambulatory Visit: Payer: Self-pay | Admitting: Neurology

## 2014-11-27 ENCOUNTER — Encounter: Payer: Self-pay | Admitting: Neurology

## 2016-06-12 ENCOUNTER — Encounter: Payer: Self-pay | Admitting: Family Medicine

## 2016-06-12 ENCOUNTER — Ambulatory Visit (INDEPENDENT_AMBULATORY_CARE_PROVIDER_SITE_OTHER): Payer: BLUE CROSS/BLUE SHIELD | Admitting: Family Medicine

## 2016-06-12 VITALS — BP 110/72 | HR 90 | Temp 98.2°F | Wt 193.2 lb

## 2016-06-12 DIAGNOSIS — J069 Acute upper respiratory infection, unspecified: Secondary | ICD-10-CM | POA: Diagnosis not present

## 2016-06-12 NOTE — Progress Notes (Signed)
Pre visit review using our clinic review tool, if applicable. No additional management support is needed unless otherwise documented below in the visit note. 

## 2016-06-12 NOTE — Progress Notes (Signed)
Subjective:    Patient ID: Erin Pratt, female    DOB: [birth year], 25 y.o.   MRN: [id number]  HPI  Erin Pratt is a 25 year old female who presents today with sinus pressure for 5 days.  Associated nasal congestion, rhinitis,  and ear pressure/pain Recent treatment for sinusitis on 05/22/2016 with amoxil for 10 days that provided moderate benefit..  Denies fever, chills, sweats, sore throat, cough,SOB, wheezing, and N/V/D. Recent sick contact exposure, history of childhood asthma with no recent use of albuterol.     Review of Systems  Constitutional: Negative for chills, fatigue and fever.  HENT: Positive for congestion and rhinorrhea. Negative for sore throat.        Ear pressure  Eyes: Negative for visual disturbance.       Itchy watery eyes  Respiratory: Negative for cough and wheezing.   Cardiovascular: Negative for chest pain and palpitations.  Gastrointestinal: Negative for abdominal pain, diarrhea, nausea and vomiting.  Genitourinary: Negative for dysuria, flank pain, hematuria and urgency.  Musculoskeletal: Negative for myalgias.  Neurological: Negative for dizziness, weakness, light-headedness and headaches.   Past Medical History:  Diagnosis Date  . Asthma   . Migraine      Social History   Social History  . Marital status: Married    Spouse name: N/A  . Number of children: 0  . Years of education: BA   Occupational History  . asst office manager    Social History Main Topics  . Smoking status: Never Smoker  . Smokeless tobacco: Never Used  . Alcohol use No  . Drug use: No  . Sexual activity: Not on file   Other Topics Concern  . Not on file   Social History Narrative  . No narrative on file    Past Surgical History:  Procedure Laterality Date  . ELBOW SURGERY Right 2008  . SHOULDER SURGERY Right 2009  . TONSILLECTOMY      Family History  Problem Relation Age of Onset  . Asthma Mother   . Hypertension Mother   . Arthritis Mother   .  Asthma Father   . Asthma Sister   . Asthma Brother   . Arthritis Maternal Grandmother     RA  . Hypertension Maternal Grandmother   . Cancer - Lung Maternal Grandfather     No Known Allergies  Current Outpatient Prescriptions on File Prior to Visit  Medication Sig Dispense Refill  . amitriptyline (ELAVIL) 10 MG tablet Take 1 tablet (10 mg total) by mouth at bedtime. (Patient not taking: Reported on 06/12/2016) 30 tablet 6  . ethynodiol-ethinyl estradiol (ZOVIA 1/50E, 28,) 1-50 MG-MCG tablet Take 1 tablet by mouth daily. (Patient not taking: Reported on 06/12/2016) 3 Package 3  . SUMAtriptan (IMITREX) 100 MG tablet Take 100 mg by mouth 2 (two) times daily as needed.     Current Facility-Administered Medications on File Prior to Visit  Medication Dose Route Frequency Provider Last Rate Last Dose  . valproate (DEPACON) 1,000 mg in sodium chloride 0.9 % 100 mL IVPB  1,000 mg Intravenous Continuous Drema Dallas, DO   Stopped at 03/29/14 1404    BP 110/72 (BP Location: Left Arm, Patient Position: Sitting, Cuff Size: Normal)   Pulse 90   Temp 98.2 F (36.8 C) (Oral)   Wt 193 lb 3.2 oz (87.6 kg)   SpO2 98%   BMI 30.26 kg/m       Objective:   Physical Exam  Constitutional: She is oriented to  person, place, and time. She appears well-developed and well-nourished.  HENT:  Right Ear: Tympanic membrane normal.  Mouth/Throat: Mucous membranes are normal. No oropharyngeal exudate or posterior oropharyngeal erythema.  Left TM dull, no bulging or retracted TM  Eyes: Pupils are equal, round, and reactive to light. No scleral icterus.  Neck: Neck supple.  Cardiovascular: Normal rate and regular rhythm.   Pulmonary/Chest: Effort normal and breath sounds normal. She has no wheezes.  Abdominal: Soft. Bowel sounds are normal. There is no tenderness.  Lymphadenopathy:    She has no cervical adenopathy.  Neurological: She is alert and oriented to person, place, and time. Coordination normal.    Skin: Skin is warm and dry. No rash noted.  Psychiatric: She has a normal mood and affect. Her behavior is normal. Judgment and thought content normal.          Assessment & Plan:  1. Acute upper respiratory infection Suspect symptoms are viral in nature and can be exacerbated by seasonal allergies. She is lactating so has stopped zyrtec at this time. Advised nasal saline rinses and ibuprofen for discomfort.  If symptoms do not improve, worsen, or she develops a fever >100, she will follow up for further evaluation and treatment.    Delano Metz, FNP-C

## 2016-06-12 NOTE — Patient Instructions (Addendum)
Your symptoms today are most likely caused by viral illness. Please drink plenty of water enough for your urine to be pale yellow or clear. You may use ibuprofen for discomfort and consider nasal saline rinses for symptoms. Follow-up for evaluation if your symptoms do not improve in 3-4 days, worsen, or you develop a fever greater than 100.  Upper Respiratory Infection, Adult Most upper respiratory infections (URIs) are a viral infection of the air passages leading to the lungs. A URI affects the nose, throat, and upper air passages. The most common type of URI is nasopharyngitis and is typically referred to as "the common cold." URIs run their course and usually go away on their own. Most of the time, a URI does not require medical attention, but sometimes a bacterial infection in the upper airways can follow a viral infection. This is called a secondary infection. Sinus and middle ear infections are common types of secondary upper respiratory infections. Bacterial pneumonia can also complicate a URI. A URI can worsen asthma and chronic obstructive pulmonary disease (COPD). Sometimes, these complications can require emergency medical care and may be life threatening.  CAUSES Almost all URIs are caused by viruses. A virus is a type of germ and can spread from one person to another.  RISKS FACTORS You may be at risk for a URI if:   You smoke.   You have chronic heart or lung disease.  You have a weakened defense (immune) system.   You are very young or very old.   You have nasal allergies or asthma.  You work in crowded or poorly ventilated areas.  You work in health care facilities or schools. SIGNS AND SYMPTOMS  Symptoms typically develop 2-3 days after you come in contact with a cold virus. Most viral URIs last 7-10 days. However, viral URIs from the influenza virus (flu virus) can last 14-18 days and are typically more severe. Symptoms may include:   Runny or stuffy (congested) nose.    Sneezing.   Cough.   Sore throat.   Headache.   Fatigue.   Fever.   Loss of appetite.   Pain in your forehead, behind your eyes, and over your cheekbones (sinus pain).  Muscle aches.  DIAGNOSIS  Your health care provider may diagnose a URI by:  Physical exam.  Tests to check that your symptoms are not due to another condition such as:  Strep throat.  Sinusitis.  Pneumonia.  Asthma. TREATMENT  A URI goes away on its own with time. It cannot be cured with medicines, but medicines may be prescribed or recommended to relieve symptoms. Medicines may help:  Reduce your fever.  Reduce your cough.  Relieve nasal congestion. HOME CARE INSTRUCTIONS   Take medicines only as directed by your health care provider.   Gargle warm saltwater or take cough drops to comfort your throat as directed by your health care provider.  Use a warm mist humidifier or inhale steam from a shower to increase air moisture. This may make it easier to breathe.  Drink enough fluid to keep your urine clear or pale yellow.   Eat soups and other clear broths and maintain good nutrition.   Rest as needed.   Return to work when your temperature has returned to normal or as your health care provider advises. You may need to stay home longer to avoid infecting others. You can also use a face mask and careful hand washing to prevent spread of the virus.  Increase the usage  of your inhaler if you have asthma.   Do not use any tobacco products, including cigarettes, chewing tobacco, or electronic cigarettes. If you need help quitting, ask your health care provider. PREVENTION  The best way to protect yourself from getting a cold is to practice good hygiene.   Avoid oral or hand contact with people with cold symptoms.   Wash your hands often if contact occurs.  There is no clear evidence that vitamin C, vitamin E, echinacea, or exercise reduces the chance of developing a cold.  However, it is always recommended to get plenty of rest, exercise, and practice good nutrition.  SEEK MEDICAL CARE IF:   You are getting worse rather than better.   Your symptoms are not controlled by medicine.   You have chills.  You have worsening shortness of breath.  You have brown or red mucus.  You have yellow or brown nasal discharge.  You have pain in your face, especially when you bend forward.  You have a fever.  You have swollen neck glands.  You have pain while swallowing.  You have white areas in the back of your throat. SEEK IMMEDIATE MEDICAL CARE IF:   You have severe or persistent:  Headache.  Ear pain.  Sinus pain.  Chest pain.  You have chronic lung disease and any of the following:  Wheezing.  Prolonged cough.  Coughing up blood.  A change in your usual mucus.  You have a stiff neck.  You have changes in your:  Vision.  Hearing.  Thinking.  Mood. MAKE SURE YOU:   Understand these instructions.  Will watch your condition.  Will get help right away if you are not doing well or get worse.   This information is not intended to replace advice given to you by your health care provider. Make sure you discuss any questions you have with your health care provider.   Document Released: 01/21/2001 Document Revised: 12/12/2014 Document Reviewed: 11/02/2013 Elsevier Interactive Patient Education [employer].

## 2017-05-22 ENCOUNTER — Ambulatory Visit (INDEPENDENT_AMBULATORY_CARE_PROVIDER_SITE_OTHER): Payer: 59

## 2017-05-22 ENCOUNTER — Ambulatory Visit (INDEPENDENT_AMBULATORY_CARE_PROVIDER_SITE_OTHER): Payer: 59 | Admitting: Family

## 2017-05-22 ENCOUNTER — Encounter (INDEPENDENT_AMBULATORY_CARE_PROVIDER_SITE_OTHER): Payer: Self-pay | Admitting: Family

## 2017-05-22 VITALS — Ht 67.0 in | Wt 192.0 lb

## 2017-05-22 DIAGNOSIS — M542 Cervicalgia: Secondary | ICD-10-CM | POA: Diagnosis not present

## 2017-05-22 DIAGNOSIS — M7652 Patellar tendinitis, left knee: Secondary | ICD-10-CM

## 2017-05-22 MED ORDER — PREDNISONE 50 MG PO TABS: ORAL_TABLET | ORAL | 0 refills | Status: DC

## 2017-05-22 NOTE — Progress Notes (Signed)
Office Visit Note   Patient: Erin Pratt           Date of Birth: [birth year]           MRN: [id number] Visit Date: 05/22/2017              Requested by: Dorena Cookey, MD [place], [place] [id number] PCP: Dorena Cookey, MD  Chief Complaint  Patient presents with  . Right Shoulder - Pain  . Left Knee - Pain      HPI: The patient is a 26 year old woman who presents today for 2 separate issues. She is complaining of left knee pain which is been ongoing for about 3 weeks. She states this has been a problem off and on over the years. She did catch play softball when she was younger for 13 years this knee gave her a lot of trouble during softball. She's got a patellar tendon brace which she has worn through the year she's been wearing this with moderate relief. Some popping, having some mechanical symptoms as well. Today knee is not bothering her.   Right shoulder pain as well. Pain radiates from neck to upper back. She is right hand dominant. Having pain down the right arm as well. Feels cannot lay on this side at night. Does co sleep. States her infant often lies on top of her right arm. Denies shoulder pain. No pain with rom of shoulder.   No known injury to right shoulder or left knee  Assessment & Plan: Visit Diagnoses:  1. Cervicalgia   2. Patellar tendinitis of left knee     Plan: Will trial pred taper for cervicalgia. Patellar tendon strap for left knee. Ibu or aleve for knee. Avoid aggravating activities such as squatting. Rest.   Follow-Up Instructions: Return in about 4 weeks (around 06/19/2017), or if symptoms worsen or fail to improve.   Left Knee Exam   Tenderness  The patient is experiencing tenderness in the patellar tendon.  Range of Motion  The patient has normal left knee ROM.  Muscle Strength   The patient has normal left knee strength.  Tests  Varus: negative Valgus: negative  Other  Erythema: absent Swelling:  none   Right Hand Exam   Muscle Strength  Grip: 5/5    Left Hand Exam   Muscle Strength  Grip:  5/5    Right Shoulder Exam  Right shoulder exam is normal.  Tenderness  The patient is experiencing no tenderness.    Range of Motion  The patient has normal right shoulder ROM.  Tests  Drop Arm: negative Impingement: negative  Other  Erythema: absent Sensation: normal Pulse: present      Patient is alert, oriented, no adenopathy, well-dressed, normal affect, normal respiratory effort. spurling positive  Imaging: No results found. No images are attached to the encounter.  Labs: No results found for: HGBA1C, ESRSEDRATE, CRP, LABURIC, REPTSTATUS, GRAMSTAIN, CULT, LABORGA  Orders:  Orders Placed This Encounter  Procedures  . XR Cervical Spine 2 or 3 views   No orders of the defined types were placed in this encounter.    Procedures: No procedures performed  Clinical Data: No additional findings.  ROS:  All other systems negative, except as noted in the HPI. Review of Systems  Constitutional: Negative for chills and fever.  Musculoskeletal: Positive for arthralgias and neck pain. Negative for gait problem and joint swelling.  Neurological: Positive for numbness. Negative for weakness.    Objective: Vital  Signs: Ht 5\' 7"  (1.702 m)   Wt 192 lb (87.1 kg)   BMI 30.07 kg/m   Specialty Comments:  No specialty comments available.  PMFS History: Patient Active Problem List   Diagnosis Date Noted  . Viral gastroenteritis 09/22/2013  . Dysplastic nevus of trunk 07/19/2012  . Breast lump on right side at 12 o'clock position 07/05/2012  . Headache, migraine, intractable 10/21/2011  . Urticaria 10/01/2011  . DUB (dysfunctional uterine bleeding) 07/01/2011   Past Medical History:  Diagnosis Date  . Asthma   . Migraine     Family History  Problem Relation Age of Onset  . Asthma Mother   . Hypertension Mother   . Arthritis Mother   . Asthma  Father   . Asthma Sister   . Asthma Brother   . Arthritis Maternal Grandmother        RA  . Hypertension Maternal Grandmother   . Cancer - Lung Maternal Grandfather     Past Surgical History:  Procedure Laterality Date  . ELBOW SURGERY Right 2008  . SHOULDER SURGERY Right 2009  . TONSILLECTOMY     Social History   Occupational History  . asst office manager    Social History Main Topics  . Smoking status: Never Smoker  . Smokeless tobacco: Never Used  . Alcohol use No  . Drug use: No  . Sexual activity: Not on file

## 2017-12-08 ENCOUNTER — Encounter: Payer: Self-pay | Admitting: Family Medicine

## 2017-12-08 ENCOUNTER — Ambulatory Visit (INDEPENDENT_AMBULATORY_CARE_PROVIDER_SITE_OTHER): Payer: 59 | Admitting: Family Medicine

## 2017-12-08 VITALS — BP 110/80 | HR 96 | Temp 98.3°F | Wt 182.7 lb

## 2017-12-08 DIAGNOSIS — J069 Acute upper respiratory infection, unspecified: Secondary | ICD-10-CM | POA: Diagnosis not present

## 2017-12-08 DIAGNOSIS — R062 Wheezing: Secondary | ICD-10-CM | POA: Diagnosis not present

## 2017-12-08 MED ORDER — PREDNISONE 20 MG PO TABS: ORAL_TABLET | ORAL | 0 refills | Status: DC

## 2017-12-08 NOTE — Patient Instructions (Signed)

## 2017-12-08 NOTE — Progress Notes (Signed)
  Subjective:     Patient ID: Erin Pratt, female   DOB: July 29, 1991, 27 y.o.   MRN: [id number]  HPI Patient seen with upper respiratory illness. She states she had onset last Thursday of some fatigue and nasal congestion. She then developed sore throat last weekend with progressive nasal discharge. Mostly clear nasal discharge. [facility] and was prescribed amoxicillin 875 mg twice daily. Past history of surgery for nasal septal deviation. She states she's not had any other sinus infections over the past couple of years. Denies any fever or chills  Also developed some cough treated with over-the-counter Delsym cough syrup. She does have reported history of mild intermittent asthma. She has Albuterol inhaler which she is not using. No dyspnea. Takes Zyrtec for seasonal allergies. Nonsmoker  Past Medical History:  Diagnosis Date  . Asthma   . Migraine    Past Surgical History:  Procedure Laterality Date  . ELBOW SURGERY Right 2008  . SHOULDER SURGERY Right 2009  . TONSILLECTOMY      reports that she has never smoked. She has never used smokeless tobacco. She reports that she does not drink alcohol or use drugs. family history includes Arthritis in her maternal grandmother and mother; Asthma in her brother, father, mother, and sister; Cancer - Lung in her maternal grandfather; Hypertension in her maternal grandmother and mother. No Known Allergies   Review of Systems  Constitutional: Negative for chills and fever.  HENT: Positive for congestion, sinus pressure and sinus pain.   Respiratory: Positive for cough.        Objective:   Physical Exam  Constitutional: She appears well-developed and well-nourished.  HENT:  Right Ear: External ear normal.  Left Ear: External ear normal.  Mouth/Throat: Oropharynx is clear and moist.  Neck: Neck supple.  Cardiovascular: Normal rate and regular rhythm.  Pulmonary/Chest: Effort normal. No respiratory distress. She has  no rales.  She has a few faint expiratory wheezes. Normal respiratory rate. No retractions. No rales  Lymphadenopathy:    She has no cervical adenopathy.       Assessment:     Acute upper respiratory illness. Differential is viral URI versus acute sinusitis. Suspect may no be getting any benefit amoxicillin because probably more likely viral. She does have mild reactive airway changes on exam    Plan:     -Finish out amoxicillin -Prednisone 20 mg 2 tablets daily for 5 days -Continue albuterol as needed -Follow-up promptly for any fever, increased shortness of breath, or other concerns  Eulas Post MD [facility]

## 2018-03-29 ENCOUNTER — Ambulatory Visit (INDEPENDENT_AMBULATORY_CARE_PROVIDER_SITE_OTHER): Payer: 59 | Admitting: Family Medicine

## 2018-03-29 ENCOUNTER — Encounter: Payer: Self-pay | Admitting: Family Medicine

## 2018-03-29 VITALS — BP 120/76 | HR 100 | Temp 98.6°F | Ht 67.0 in | Wt 181.6 lb

## 2018-03-29 DIAGNOSIS — S39012A Strain of muscle, fascia and tendon of lower back, initial encounter: Secondary | ICD-10-CM | POA: Diagnosis not present

## 2018-03-29 DIAGNOSIS — J019 Acute sinusitis, unspecified: Secondary | ICD-10-CM | POA: Diagnosis not present

## 2018-03-29 MED ORDER — AZITHROMYCIN 250 MG PO TABS: ORAL_TABLET | ORAL | 0 refills | Status: DC

## 2018-03-29 MED ORDER — CYCLOBENZAPRINE HCL 10 MG PO TABS: 10.0000 mg | ORAL_TABLET | Freq: Three times a day (TID) | ORAL | 0 refills | Status: DC | PRN

## 2018-03-29 MED ORDER — HYDROCODONE-HOMATROPINE 5-1.5 MG/5ML PO SYRP: 5.0000 mL | ORAL_SOLUTION | ORAL | 0 refills | Status: DC | PRN

## 2018-03-29 NOTE — Progress Notes (Signed)
   Subjective:    Patient ID: Erin Pratt, female    DOB: 12-22-1990, 27 y.o.   MRN: [id number]  HPI Here for one week of sinus pressure, PND, ST, and a dry cough. She has tried Delsym and Nyquil with no relief. Also she complains of 2 weeks of sharp left sided low back pain. She is not sure but she thinks this may be related to her kick boxing. She has stopped this for the past 2 weeks to rest her back. No pain into the legs. Using some Ibuprofen. It gets stiff at night.   Review of Systems  Constitutional: Negative.   HENT: Positive for congestion, postnasal drip, sinus pressure, sinus pain and sore throat.   Eyes: Negative.   Respiratory: Positive for cough.   Musculoskeletal: Positive for back pain.       Objective:   Physical Exam  Constitutional: She appears well-developed and well-nourished.  HENT:  Right Ear: External ear normal.  Left Ear: External ear normal.  Nose: Nose normal.  Mouth/Throat: Oropharynx is clear and moist.  Eyes: Conjunctivae are normal.  Neck: No thyromegaly present.  Pulmonary/Chest: Effort normal and breath sounds normal. No stridor. No respiratory distress. She has no wheezes. She has no rales.  Musculoskeletal:  She is mildly tender in the left lower back, some spasm is present. ROM is full and SLR are negative  Lymphadenopathy:    She has no cervical adenopathy.          Assessment & Plan:  Early sinusitis, treat with a Zpack. Use Hydromet for cough. She also las a lumbar strain. Use heat, 800 mg of Ibuprofen prn, and add Flexeril as needed. Recheck prn. Alysia Penna, MD

## 2018-05-13 ENCOUNTER — Encounter (HOSPITAL_COMMUNITY): Payer: Self-pay

## 2018-05-13 ENCOUNTER — Emergency Department (HOSPITAL_COMMUNITY): Payer: 59

## 2018-05-13 ENCOUNTER — Other Ambulatory Visit: Payer: Self-pay

## 2018-05-13 ENCOUNTER — Emergency Department (HOSPITAL_COMMUNITY)
Admission: EM | Admit: 2018-05-13 | Discharge: 2018-05-13 | Disposition: A | Discharge status: Home / Self Care | Payer: 59 | Attending: Emergency Medicine | Admitting: Emergency Medicine

## 2018-05-13 DIAGNOSIS — R51 Headache: Secondary | ICD-10-CM | POA: Diagnosis not present

## 2018-05-13 DIAGNOSIS — M545 Low back pain, unspecified: Secondary | ICD-10-CM

## 2018-05-13 DIAGNOSIS — M25522 Pain in left elbow: Secondary | ICD-10-CM | POA: Insufficient documentation

## 2018-05-13 DIAGNOSIS — M25512 Pain in left shoulder: Secondary | ICD-10-CM | POA: Diagnosis present

## 2018-05-13 DIAGNOSIS — M79642 Pain in left hand: Secondary | ICD-10-CM | POA: Diagnosis not present

## 2018-05-13 DIAGNOSIS — Y998 Other external cause status: Secondary | ICD-10-CM | POA: Diagnosis not present

## 2018-05-13 DIAGNOSIS — T07XXXA Unspecified multiple injuries, initial encounter: Secondary | ICD-10-CM | POA: Diagnosis not present

## 2018-05-13 DIAGNOSIS — J45909 Unspecified asthma, uncomplicated: Secondary | ICD-10-CM | POA: Insufficient documentation

## 2018-05-13 DIAGNOSIS — Y92410 Unspecified street and highway as the place of occurrence of the external cause: Secondary | ICD-10-CM | POA: Insufficient documentation

## 2018-05-13 DIAGNOSIS — Z79899 Other long term (current) drug therapy: Secondary | ICD-10-CM | POA: Diagnosis not present

## 2018-05-13 DIAGNOSIS — Y9389 Activity, other specified: Secondary | ICD-10-CM | POA: Insufficient documentation

## 2018-05-13 LAB — CBC
HEMATOCRIT: 36 % (ref 36.0–46.0)
HEMOGLOBIN: 12 g/dL (ref 12.0–15.0)
MCH: 29.7 pg (ref 26.0–34.0)
MCHC: 33.3 g/dL (ref 30.0–36.0)
MCV: 89.1 fL (ref 78.0–100.0)
Platelets: 285 10*3/uL (ref 150–400)
RBC: 4.04 MIL/uL (ref 3.87–5.11)
RDW: 12.7 % (ref 11.5–15.5)
WBC: 8.6 10*3/uL (ref 4.0–10.5)

## 2018-05-13 LAB — COMPREHENSIVE METABOLIC PANEL
ALBUMIN: 3.4 g/dL — AB (ref 3.5–5.0)
ALT: 35 U/L (ref 0–44)
ANION GAP: 12 (ref 5–15)
AST: 32 U/L (ref 15–41)
Alkaline Phosphatase: 54 U/L (ref 38–126)
BILIRUBIN TOTAL: 0.4 mg/dL (ref 0.3–1.2)
BUN: 6 mg/dL (ref 6–20)
CHLORIDE: 108 mmol/L (ref 98–111)
CO2: 20 mmol/L — ABNORMAL LOW (ref 22–32)
Calcium: 8.8 mg/dL — ABNORMAL LOW (ref 8.9–10.3)
Creatinine, Ser: 0.8 mg/dL (ref 0.44–1.00)
GFR calc Af Amer: >60 mL/min (ref >60)
GLUCOSE: 96 mg/dL (ref 70–99)
POTASSIUM: 3 mmol/L — AB (ref 3.5–5.1)
Sodium: 140 mmol/L (ref 135–145)
TOTAL PROTEIN: 6.5 g/dL (ref 6.5–8.1)

## 2018-05-13 LAB — LIPASE, BLOOD: LIPASE: 34 U/L (ref 11–51)

## 2018-05-13 LAB — I-STAT BETA HCG BLOOD, ED (MC, WL, AP ONLY)

## 2018-05-13 MED ORDER — ACETAMINOPHEN 500 MG PO TABS
1000.0000 mg | ORAL_TABLET | Freq: Once | ORAL | Status: AC
  Administered 2018-05-13: 1000 mg via ORAL
  Filled 2018-05-13: qty 2

## 2018-05-13 MED ORDER — POTASSIUM CHLORIDE CRYS ER 20 MEQ PO TBCR
40.0000 meq | EXTENDED_RELEASE_TABLET | Freq: Once | ORAL | Status: AC
  Administered 2018-05-13: 40 meq via ORAL
  Filled 2018-05-13: qty 2

## 2018-05-13 MED ORDER — BACITRACIN ZINC 500 UNIT/GM EX OINT
1.0000 "application " | TOPICAL_OINTMENT | Freq: Two times a day (BID) | CUTANEOUS | Status: DC
  Administered 2018-05-13: 1 via TOPICAL

## 2018-05-13 MED ORDER — KETOROLAC TROMETHAMINE 30 MG/ML IJ SOLN
30.0000 mg | Freq: Once | INTRAMUSCULAR | Status: AC
  Administered 2018-05-13: 30 mg via INTRAVENOUS
  Filled 2018-05-13: qty 1

## 2018-05-13 NOTE — ED Notes (Signed)
Pt ambulated to bathroom with steady gait. Pt stated she was dizzy when she went from sitting to standing. However when ambulating pt had no dizziness. Pt is back in her room at this time with no assistance needed.
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
## 2018-05-13 NOTE — ED Notes (Signed)
Pt ambulated to and from restroom with tech.
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
## 2018-05-13 NOTE — Discharge Instructions (Signed)
Return to ER if any severe worsening of pain, extremity weakness, confusion, vomiting, lethargy, vision changes, chest pain, breathing problems, or abdominal pain.

## 2018-05-13 NOTE — ED Notes (Signed)
Pt given crackers and water. 

## 2018-05-14 ENCOUNTER — Telehealth: Payer: Self-pay | Admitting: Family Medicine

## 2018-05-14 NOTE — Telephone Encounter (Signed)
Patient requested a call back from [place]. Dr. Sherren Mocha not in office.
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
## 2018-05-17 NOTE — Telephone Encounter (Signed)
Is there anything else the pt can take for pain?  Please advise

## 2018-05-17 NOTE — Telephone Encounter (Signed)
Tillie Rung,  Not a pt of Dr. Sarajane Jews.  Todd pt but not seen since 2015.  See if Dr. Sherren Mocha would like to see this pt in the office.

## 2018-05-17 NOTE — Telephone Encounter (Signed)
I am not familiar with the patient.  In reviewing her records it looks like she is a patient of Dr. Sarajane Jews
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
## 2018-05-19 ENCOUNTER — Ambulatory Visit (INDEPENDENT_AMBULATORY_CARE_PROVIDER_SITE_OTHER): Payer: 59 | Admitting: Family Medicine

## 2018-05-19 ENCOUNTER — Encounter: Payer: Self-pay | Admitting: Family Medicine

## 2018-05-19 DIAGNOSIS — M549 Dorsalgia, unspecified: Secondary | ICD-10-CM | POA: Diagnosis not present

## 2018-05-19 DIAGNOSIS — M25561 Pain in right knee: Secondary | ICD-10-CM

## 2018-05-19 DIAGNOSIS — M25562 Pain in left knee: Secondary | ICD-10-CM | POA: Diagnosis not present

## 2018-05-19 MED ORDER — TRAMADOL HCL 50 MG PO TABS: 50.0000 mg | ORAL_TABLET | Freq: Three times a day (TID) | ORAL | 1 refills | Status: DC | PRN
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
## 2018-05-19 NOTE — Patient Instructions (Signed)
Tramadol 50 mg.......... half a tab in the morning,,,,,,,,,,,,, half a tab at bedtime for pain  Continue Motrin 400 mg 3 times daily with food  Ice your knees as often as you can  We will get to set up for physical therapy ASAP to help relieve your back and knee pain.

## 2018-06-29 ENCOUNTER — Encounter: Payer: Self-pay | Admitting: Family Medicine

## 2018-06-29 ENCOUNTER — Ambulatory Visit (INDEPENDENT_AMBULATORY_CARE_PROVIDER_SITE_OTHER): Payer: 59 | Admitting: Family Medicine

## 2018-06-29 VITALS — BP 108/80 | HR 80 | Temp 98.4°F | Wt 177.0 lb

## 2018-06-29 DIAGNOSIS — J029 Acute pharyngitis, unspecified: Secondary | ICD-10-CM | POA: Diagnosis not present

## 2018-06-29 DIAGNOSIS — B9789 Other viral agents as the cause of diseases classified elsewhere: Secondary | ICD-10-CM | POA: Diagnosis not present

## 2018-06-29 DIAGNOSIS — J069 Acute upper respiratory infection, unspecified: Secondary | ICD-10-CM

## 2018-06-29 LAB — POCT RAPID STREP A (OFFICE): RAPID STREP A SCREEN: NEGATIVE

## 2018-06-29 LAB — POCT INFLUENZA A/B
Influenza A, POC: NEGATIVE
Influenza B, POC: NEGATIVE

## 2018-06-29 NOTE — Patient Instructions (Signed)
Upper Respiratory Infection, Adult Most upper respiratory infections (URIs) are a viral infection of the air passages leading to the lungs. A URI affects the nose, throat, and upper air passages. The most common type of URI is nasopharyngitis and is typically referred to as "the common cold." URIs run their course and usually go away on their own. Most of the time, a URI does not require medical attention, but sometimes a bacterial infection in the upper airways can follow a viral infection. This is called a secondary infection. Sinus and middle ear infections are common types of secondary upper respiratory infections. Bacterial pneumonia can also complicate a URI. A URI can worsen asthma and chronic obstructive pulmonary disease (COPD). Sometimes, these complications can require emergency medical care and may be life threatening. What are the causes? Almost all URIs are caused by viruses. A virus is a type of germ and can spread from one person to another. What increases the risk? You may be at risk for a URI if:  You smoke.  You have chronic heart or lung disease.  You have a weakened defense (immune) system.  You are very young or very old.  You have nasal allergies or asthma.  You work in crowded or poorly ventilated areas.  You work in health care facilities or schools.  What are the signs or symptoms? Symptoms typically develop 2-3 days after you come in contact with a cold virus. Most viral URIs last 7-10 days. However, viral URIs from the influenza virus (flu virus) can last 14-18 days and are typically more severe. Symptoms may include:  Runny or stuffy (congested) nose.  Sneezing.  Cough.  Sore throat.  Headache.  Fatigue.  Fever.  Loss of appetite.  Pain in your forehead, behind your eyes, and over your cheekbones (sinus pain).  Muscle aches.  How is this diagnosed? Your health care provider may diagnose a URI by:  Physical exam.  Tests to check that your  symptoms are not due to another condition such as: ? Strep throat. ? Sinusitis. ? Pneumonia. ? Asthma.  How is this treated? A URI goes away on its own with time. It cannot be cured with medicines, but medicines may be prescribed or recommended to relieve symptoms. Medicines may help:  Reduce your fever.  Reduce your cough.  Relieve nasal congestion.  Follow these instructions at home:  Take medicines only as directed by your health care provider.  Gargle warm saltwater or take cough drops to comfort your throat as directed by your health care provider.  Use a warm mist humidifier or inhale steam from a shower to increase air moisture. This may make it easier to breathe.  Drink enough fluid to keep your urine clear or pale yellow.  Eat soups and other clear broths and maintain good nutrition.  Rest as needed.  Return to work when your temperature has returned to normal or as your health care provider advises. You may need to stay home longer to avoid infecting others. You can also use a face mask and careful hand washing to prevent spread of the virus.  Increase the usage of your inhaler if you have asthma.  Do not use any tobacco products, including cigarettes, chewing tobacco, or electronic cigarettes. If you need help quitting, ask your health care provider. How is this prevented? The best way to protect yourself from getting a cold is to practice good hygiene.  Avoid oral or hand contact with people with cold symptoms.  Wash your   hands often if contact occurs.  There is no clear evidence that vitamin C, vitamin E, echinacea, or exercise reduces the chance of developing a cold. However, it is always recommended to get plenty of rest, exercise, and practice good nutrition. Contact a health care provider if:  You are getting worse rather than better.  Your symptoms are not controlled by medicine.  You have chills.  You have worsening shortness of breath.  You have  brown or red mucus.  You have yellow or brown nasal discharge.  You have pain in your face, especially when you bend forward.  You have a fever.  You have swollen neck glands.  You have pain while swallowing.  You have white areas in the back of your throat. Get help right away if:  You have severe or persistent: ? Headache. ? Ear pain. ? Sinus pain. ? Chest pain.  You have chronic lung disease and any of the following: ? Wheezing. ? Prolonged cough. ? Coughing up blood. ? A change in your usual mucus.  You have a stiff neck.  You have changes in your: ? Vision. ? Hearing. ? Thinking. ? Mood. This information is not intended to replace advice given to you by your health care provider. Make sure you discuss any questions you have with your health care provider. Document Released: 01/21/2001 Document Revised: 03/30/2016 Document Reviewed: 11/02/2013 Elsevier Interactive Patient Education  2018 [employer] Inc[employer]  

## 2018-06-29 NOTE — Progress Notes (Signed)
Subjective:    Patient ID: Erin Pratt, female    DOB: 16-Oct-1990, 27 y.o.   MRN: [id number]  No chief complaint on file.   HPI Patient was seen today for acute concern.  Pt endorses sore throat, congestion, chills, HA, productive cough, feeling achy, and increased sleepiness x 1 wk.  Denies fever, ear or facial pain/pressure.  Pt tried tylenol cold and flu for her symptoms.  Sick contacts include pt's husband and son who had similar symptoms.  Past Medical History:  Diagnosis Date  . Asthma   . Migraine     No Known Allergies  ROS General: Denies fever, chills, night sweats, changes in weight, changes in appetite  +fatigue, achy HEENT: Denies headaches, ear pain, changes in vision, +rhinorrhea, sore throat, congestion, HA CV: Denies CP, palpitations, SOB, orthopnea Pulm: Denies SOB, wheezing  +cough GI: Denies abdominal pain, nausea, vomiting, diarrhea, constipation GU: Denies dysuria, hematuria, frequency, vaginal discharge Msk: Denies muscle cramps, joint pains Neuro: Denies weakness, numbness, tingling Skin: Denies rashes, bruising Psych: Denies depression, anxiety, hallucinations     Objective:    Blood pressure 108/80, pulse 80, temperature 98.4 F (36.9 C), temperature source Oral, weight 177 lb (80.3 kg), SpO2 98 %.  Gen. Pleasant, well-nourished, in no distress, normal affect  HEENT: [place]/AT, face symmetric, no scleral icterus, PERRLA, nares patent without drainage, pharynx without erythema or exudate. TMs full b/l.  No cervical lymphadenopathy Lungs: no accessory muscle use, CTAB, no wheezes or rales Cardiovascular: RRR, no m/r/g, no peripheral edema Neuro:  A&Ox3, CN II-XII intact, normal gait  Wt Readings from Last 3 Encounters:  06/29/18 177 lb (80.3 kg)  05/19/18 184 lb (83.5 kg)  05/13/18 175 lb (79.4 kg)    Lab Results  Component Value Date   WBC 8.6 05/13/2018   HGB 12.0 05/13/2018   HCT 36.0 05/13/2018   PLT 285 05/13/2018   GLUCOSE 96 05/13/2018    CHOL 115 06/08/2012   TRIG 53.0 06/08/2012   HDL 68.20 06/08/2012   LDLCALC 36 06/08/2012   ALT 35 05/13/2018   AST 32 05/13/2018   NA 140 05/13/2018   K 3.0 (L) 05/13/2018   CL 108 05/13/2018   CREATININE 0.80 05/13/2018   BUN 6 05/13/2018   CO2 20 (L) 05/13/2018   TSH 1.40 06/08/2012    Assessment/Plan:  Viral URI with cough -supportive care -given handout  Sore throat  -supportive care, gargle with warm salt water or chloraseptic spray - Plan: POCT rapid strep A  negative, POC Influenza A/B negative  F/u prn  Grier Mitts, MD

## 2018-07-01 ENCOUNTER — Encounter: Payer: Self-pay | Admitting: Family Medicine

## 2018-09-30 ENCOUNTER — Ambulatory Visit (INDEPENDENT_AMBULATORY_CARE_PROVIDER_SITE_OTHER): Payer: 59 | Admitting: Internal Medicine

## 2018-09-30 ENCOUNTER — Encounter: Payer: Self-pay | Admitting: Internal Medicine

## 2018-09-30 VITALS — BP 110/78 | HR 75 | Temp 98.4°F | Wt 173.7 lb

## 2018-09-30 DIAGNOSIS — B9789 Other viral agents as the cause of diseases classified elsewhere: Secondary | ICD-10-CM | POA: Diagnosis not present

## 2018-09-30 DIAGNOSIS — J069 Acute upper respiratory infection, unspecified: Secondary | ICD-10-CM | POA: Diagnosis not present

## 2018-09-30 NOTE — Progress Notes (Signed)
Acute Office Visit     CC/Reason for Visit: cough, sinus pressure  HPI: Erin Pratt is a 28 y.o. female who is coming in today for the above mentioned reasons. 2 yr old son has been sick with flu-like symptoms for a week. He tested negative for the flu. 7 days ago she started having non-productive cough, chills, maybe some low-grade temps, sore throat, nasal congestion. She has a h/o mild intermittent asthma and has only used her inhaler once this past week.   Past Medical/Surgical History: Past Medical History:  Diagnosis Date  . Asthma   . Migraine     Past Surgical History:  Procedure Laterality Date  . CESAREAN SECTION    . ELBOW SURGERY Right 2008  . SHOULDER SURGERY Right 2009  . TONSILLECTOMY      Social History:  reports that she has never smoked. She has never used smokeless tobacco. She reports that she does not drink alcohol or use drugs.  Allergies: No Known Allergies  Family History:  Family History  Problem Relation Age of Onset  . Asthma Mother   . Hypertension Mother   . Arthritis Mother   . Asthma Father   . Asthma Sister   . Asthma Brother   . Arthritis Maternal Grandmother        RA  . Hypertension Maternal Grandmother   . Cancer - Lung Maternal Grandfather      Current Outpatient Medications:  .  albuterol (PROVENTIL HFA;VENTOLIN HFA) 108 (90 Base) MCG/ACT inhaler, Inhale 2 puffs into the lungs every 4 (four) hours as needed for wheezing or shortness of breath., Disp: , Rfl:  .  cetirizine (ZYRTEC) 10 MG tablet, Take 10 mg by mouth daily as needed for allergies., Disp: , Rfl:  .  traMADol (ULTRAM) 50 MG tablet, Take 1 tablet (50 mg total) by mouth every 8 (eight) hours as needed., Disp: 30 tablet, Rfl: 1 .  TRI-LO-MARZIA 0.18/0.215/0.25 MG-25 MCG tab, Take 1 tablet by mouth daily., Disp: , Rfl: 11  Current Facility-Administered Medications:  .  valproate (DEPACON) 1,000 mg in sodium chloride 0.9 % 100 mL IVPB, 1,000 mg, Intravenous,  Continuous, Drema Dallas, DO, Stopped at 03/29/14 1404  Review of Systems:  Constitutional: Positive for low grade fever, chills and fatigue.  HEENT: Denies photophobia, eye pain, redness, hearing loss, sneezing, mouth sores, trouble swallowing, neck pain, neck stiffness and tinnitus.  +for left ear pain, congestion, rhinorrhea and sore throat. Respiratory: Denies SOB, DOE,  chest tightness,  and wheezing.  +for cough Cardiovascular: Denies chest pain, palpitations and leg swelling.  Gastrointestinal: Denies nausea, vomiting, abdominal pain, diarrhea, constipation, blood in stool and abdominal distention.  Genitourinary: Denies dysuria, urgency, frequency, hematuria, flank pain and difficulty urinating.  Endocrine: Denies: hot or cold intolerance, sweats, changes in hair or nails, polyuria, polydipsia. Musculoskeletal: Denies myalgias, back pain, joint swelling, arthralgias and gait problem.  Skin: Denies pallor, rash and wound.  Neurological: Denies dizziness, seizures, syncope, weakness, light-headedness, numbness and headaches.  Hematological: Denies adenopathy. Easy bruising, personal or family bleeding history  Psychiatric/Behavioral: Denies suicidal ideation, mood changes, confusion, nervousness, sleep disturbance and agitation    Physical Exam: Vitals:   09/30/18 1136  BP: 110/78  Pulse: 75  Temp: 98.4 F (36.9 C)  TempSrc: Oral  SpO2: 94%  Weight: 173 lb 11.2 oz (78.8 kg)    Body mass index is 27.21 kg/m.   Constitutional: NAD, calm, comfortable Eyes: PERRL, lids and conjunctivae normal ENMT: Mucous membranes  are moist. Posterior pharynx erythematous butclear of any exudate or lesions. Normal dentition. Tympanic membrane is pearly white, no erythema or bulging. Neck: normal, supple, no masses, no thyromegaly, no LAD Respiratory: clear to auscultation bilaterally, no wheezing, no crackles. Normal respiratory effort. No accessory muscle use.  Cardiovascular: Regular  rate and rhythm, no murmurs / rubs / gallops. No extremity edema. 2+ pedal pulses. No carotid bruits.  Psychiatric: Normal judgment and insight. Alert and oriented x 3. Normal mood.    Impression and Plan:  Viral URI with cough -Given exam findings, PNA, pharyngitis, ear infection are not likely, hence abx have not been prescribed. -Have advised rest, fluids, OTC antihistamines, cough suppressants and mucinex. -RTC if no improvement in 10-14 days.     Patient Instructions  -Nice to meet you today!  -May use over the counter mucinex twice daily, delsym twice daily for cough and tylenol sinus 2 tablets twice a day for pain and congestion.  -Schedule follow up in 10-14 days if not better.   Upper Respiratory Infection, Adult An upper respiratory infection (URI) affects the nose, throat, and upper air passages. URIs are caused by germs (viruses). The most common type of URI is often called "the common cold." Medicines cannot cure URIs, but you can do things at home to relieve your symptoms. URIs usually get better within 7-10 days. Follow these instructions at home: Activity  Rest as needed.  If you have a fever, stay home from work or school until your fever is gone, or until your doctor says you may return to work or school. ? You should stay home until you cannot spread the infection anymore (you are not contagious). ? Your doctor may have you wear a face mask so you have less risk of spreading the infection. Relieving symptoms  Gargle with a salt-water mixture 3-4 times a day or as needed. To make a salt-water mixture, completely dissolve -1 tsp of salt in 1 cup of warm water.  Use a cool-mist humidifier to add moisture to the air. This can help you breathe more easily. Eating and drinking   Drink enough fluid to keep your pee (urine) pale yellow.  Eat soups and other clear broths. General instructions   Take over-the-counter and prescription medicines only as told by  your doctor. These include cold medicines, fever reducers, and cough suppressants.  Do not use any products that contain nicotine or tobacco. These include cigarettes and e-cigarettes. If you need help quitting, ask your doctor.  Avoid being where people are smoking (avoid secondhand smoke).  Make sure you get regular shots and get the flu shot every year.  Keep all follow-up visits as told by your doctor. This is important. How to avoid spreading infection to others   Wash your hands often with soap and water. If you do not have soap and water, use hand sanitizer.  Avoid touching your mouth, face, eyes, or nose.  Cough or sneeze into a tissue or your sleeve or elbow. Do not cough or sneeze into your hand or into the air. Contact a doctor if:  You are getting worse, not better.  You have any of these: ? A fever. ? Chills. ? Brown or red mucus in your nose. ? Yellow or brown fluid (discharge)coming from your nose. ? Pain in your face, especially when you bend forward. ? Swollen neck glands. ? Pain with swallowing. ? White areas in the back of your throat. Get help right away if:  You have  shortness of breath that gets worse.  You have very bad or constant: ? Headache. ? Ear pain. ? Pain in your forehead, behind your eyes, and over your cheekbones (sinus pain). ? Chest pain.  You have long-lasting (chronic) lung disease along with any of these: ? Wheezing. ? Long-lasting cough. ? Coughing up blood. ? A change in your usual mucus.  You have a stiff neck.  You have changes in your: ? Vision. ? Hearing. ? Thinking. ? Mood. Summary  An upper respiratory infection (URI) is caused by a germ called a virus. The most common type of URI is often called "the common cold."  URIs usually get better within 7-10 days.  Take over-the-counter and prescription medicines only as told by your doctor. This information is not intended to replace advice given to you by your health  care provider. Make sure you discuss any questions you have with your health care provider. Document Released: 01/14/2008 Document Revised: 03/20/2017 Document Reviewed: 03/20/2017 Elsevier Interactive Patient Education  2019 [place], [place] [facility]

## 2018-09-30 NOTE — Patient Instructions (Signed)
-Nice to meet you today!  -May use over the counter mucinex twice daily, delsym twice daily for cough and tylenol sinus 2 tablets twice a day for pain and congestion.  -Schedule follow up in 10-14 days if not better.   Upper Respiratory Infection, Adult An upper respiratory infection (URI) affects the nose, throat, and upper air passages. URIs are caused by germs (viruses). The most common type of URI is often called "the common cold." Medicines cannot cure URIs, but you can do things at home to relieve your symptoms. URIs usually get better within 7-10 days. Follow these instructions at home: Activity  Rest as needed.  If you have a fever, stay home from work or school until your fever is gone, or until your doctor says you may return to work or school. ? You should stay home until you cannot spread the infection anymore (you are not contagious). ? Your doctor may have you wear a face mask so you have less risk of spreading the infection. Relieving symptoms  Gargle with a salt-water mixture 3-4 times a day or as needed. To make a salt-water mixture, completely dissolve -1 tsp of salt in 1 cup of warm water.  Use a cool-mist humidifier to add moisture to the air. This can help you breathe more easily. Eating and drinking   Drink enough fluid to keep your pee (urine) pale yellow.  Eat soups and other clear broths. General instructions   Take over-the-counter and prescription medicines only as told by your doctor. These include cold medicines, fever reducers, and cough suppressants.  Do not use any products that contain nicotine or tobacco. These include cigarettes and e-cigarettes. If you need help quitting, ask your doctor.  Avoid being where people are smoking (avoid secondhand smoke).  Make sure you get regular shots and get the flu shot every year.  Keep all follow-up visits as told by your doctor. This is important. How to avoid spreading infection to others   Wash  your hands often with soap and water. If you do not have soap and water, use hand sanitizer.  Avoid touching your mouth, face, eyes, or nose.  Cough or sneeze into a tissue or your sleeve or elbow. Do not cough or sneeze into your hand or into the air. Contact a doctor if:  You are getting worse, not better.  You have any of these: ? A fever. ? Chills. ? Brown or red mucus in your nose. ? Yellow or brown fluid (discharge)coming from your nose. ? Pain in your face, especially when you bend forward. ? Swollen neck glands. ? Pain with swallowing. ? White areas in the back of your throat. Get help right away if:  You have shortness of breath that gets worse.  You have very bad or constant: ? Headache. ? Ear pain. ? Pain in your forehead, behind your eyes, and over your cheekbones (sinus pain). ? Chest pain.  You have long-lasting (chronic) lung disease along with any of these: ? Wheezing. ? Long-lasting cough. ? Coughing up blood. ? A change in your usual mucus.  You have a stiff neck.  You have changes in your: ? Vision. ? Hearing. ? Thinking. ? Mood. Summary  An upper respiratory infection (URI) is caused by a germ called a virus. The most common type of URI is often called "the common cold."  URIs usually get better within 7-10 days.  Take over-the-counter and prescription medicines only as told by your doctor. This information is  not intended to replace advice given to you by your health care provider. Make sure you discuss any questions you have with your health care provider. Document Released: 01/14/2008 Document Revised: 03/20/2017 Document Reviewed: 03/20/2017 Elsevier Interactive Patient Education  2019 [employer].

## 2018-10-12 ENCOUNTER — Ambulatory Visit (INDEPENDENT_AMBULATORY_CARE_PROVIDER_SITE_OTHER): Payer: 59 | Admitting: Family Medicine

## 2018-10-12 ENCOUNTER — Encounter: Payer: Self-pay | Admitting: Family Medicine

## 2018-10-12 ENCOUNTER — Ambulatory Visit: Payer: Self-pay

## 2018-10-12 VITALS — BP 110/74 | HR 96 | Temp 99.0°F | Wt 175.0 lb

## 2018-10-12 DIAGNOSIS — J4520 Mild intermittent asthma, uncomplicated: Secondary | ICD-10-CM

## 2018-10-12 DIAGNOSIS — J069 Acute upper respiratory infection, unspecified: Secondary | ICD-10-CM | POA: Diagnosis not present

## 2018-10-12 DIAGNOSIS — R52 Pain, unspecified: Secondary | ICD-10-CM | POA: Diagnosis not present

## 2018-10-12 DIAGNOSIS — B9789 Other viral agents as the cause of diseases classified elsewhere: Secondary | ICD-10-CM

## 2018-10-12 LAB — POCT INFLUENZA A/B
INFLUENZA B, POC: NEGATIVE
Influenza A, POC: NEGATIVE

## 2018-10-12 MED ORDER — ALBUTEROL SULFATE HFA 108 (90 BASE) MCG/ACT IN AERS: 2.0000 | INHALATION_SPRAY | RESPIRATORY_TRACT | 0 refills | Status: DC | PRN

## 2018-10-12 NOTE — Telephone Encounter (Signed)
Noted  

## 2018-10-12 NOTE — Telephone Encounter (Signed)
C/o increased shortness of breath when she woke up at 4:30 AM.  Stated she has "mild asthma."  C/o it being hard to take a deep breath; "when trying to take a deep breath, it feels weak, and feels like chest is kind of caving in, and then I cough."  Stated her shortness of breath is "mild to moderate".  Denied wheezing.  Reported she took one dose of Albuterol Inhaler, and it didn't seem to help. Reported she is intermittently coughing up yellow mucus.  Stated she had an upper resp. virus last week; had cough, low grade fever.  Today, reported she feels hot, but has not checked temperature.  C/o body aches.  Has appt. to establish care with new provider tomorrow.  Appt. scheduled today at 2:00 PM, due to difficulty with breathing and feeling Albuterol Inhaler is not effective.         Reason for Disposition . [1] MILD difficulty breathing (e.g., minimal/no SOB at rest, SOB with walking, pulse <100) AND [2] NEW-onset or WORSE than normal  Answer Assessment - Initial Assessment Questions 1. RESPIRATORY STATUS: "Describe your breathing?" (e.g., wheezing, shortness of breath, unable to speak, severe coughing)      When trying to take a deep breath, it feels weak and chest is kind of caving in; can't take a full breath, and it provokes coughing   2. ONSET: "When did this breathing problem begin?"      4:30 AM  3. PATTERN "Does the difficult breathing come and go, or has it been constant since it started?"      constant 4. SEVERITY: "How bad is your breathing?" (e.g., mild, moderate, severe)    - MILD: No SOB at rest, mild SOB with walking, speaks normally in sentences, can lay down, no retractions, pulse < 100.    - MODERATE: SOB at rest, SOB with minimal exertion and prefers to sit, cannot lie down flat, speaks in phrases, mild retractions, audible wheezing, pulse 100-120.    - SEVERE: Very SOB at rest, speaks in single words, struggling to breathe, sitting hunched forward, retractions, pulse > 120   Mild to moderate 5. RECURRENT SYMPTOM: "Have you had difficulty breathing before?" If so, ask: "When was the last time?" and "What happened that time?"    yes 6. CARDIAC HISTORY: "Do you have any history of heart disease?" (e.g., heart attack, angina, bypass surgery, angioplasty)      No  7. LUNG HISTORY: "Do you have any history of lung disease?"  (e.g., pulmonary embolus, asthma, emphysema)     Hx asthma; typically uses inhaler with change in weather or increased exercise 8. CAUSE: "What do you think is causing the breathing problem?"      Asthma  9. OTHER SYMPTOMS: "Do you have any other symptoms? (e.g., dizziness, runny nose, cough, chest pain, fever)    Recent upper resp. Virus last week with cough, sore throat, low grade fever.  C/o body aches and feels hot at present. Intermittent cough with yellow,  Mucus; denied sore throat 10. PREGNANCY: "Is there any chance you are pregnant?" "When was your last menstrual period?"       LMP; last week 11. TRAVEL: "Have you traveled out of the country in the last month?" (e.g., travel history, exposures)       Denies recent travel  Protocols used: BREATHING DIFFICULTY-A-AH

## 2018-10-12 NOTE — Progress Notes (Signed)
Subjective:    Patient ID: Erin Pratt, female    DOB: [birth year], 28 y.o.   MRN: [id number]  No chief complaint on file.   HPI Patient was seen today for acute concern.  Patient endorses feeling short of breath and having wheezing this morning.  Patient tried to use her albuterol inhaler but states it has expired and did not seem like it was helping.  Patient later developed headache, low-grade fever, fatigue, chest congestion today.  Patient has not tried anything else for symptoms.  Sick contacts include patient's mother who has the flu, pneumonia, and bronchitis.  Patient requesting refill on albuterol inhaler.  Past Medical History:  Diagnosis Date  . Asthma   . Migraine     No Known Allergies  ROS General: Denies chills, night sweats, changes in weight, changes in appetite +fever, fatigue HEENT: Denies ear pain, changes in vision, rhinorrhea, sore throat  + headache CV: Denies CP, palpitations, SOB, orthopnea Pulm: Denies SOB   + chest congestion, cough, wheezing GI: Denies abdominal pain, nausea, vomiting, diarrhea, constipation GU: Denies dysuria, hematuria, frequency, vaginal discharge Msk: Denies muscle cramps, joint pains Neuro: Denies weakness, numbness, tingling Skin: Denies rashes, bruising Psych: Denies depression, anxiety, hallucinations    Objective:    Blood pressure 110/74, pulse 96, temperature 99 F (37.2 C), temperature source Oral, weight 175 lb (79.4 kg), SpO2 97 %.  Gen. Pleasant, well-nourished, in no distress, normal affect HEENT: [place]/AT, face symmetric, no scleral icterus, PERRLA,  nares patent without drainage, pharynx without erythema or exudate.  TMs full bilaterally.  No cervical lymphadenopathy. Lungs: Cough, no accessory muscle use, CTAB, no wheezes or rales  Cardiovascular: RRR, no m/r/g, no peripheral edema Abdomen: BS present, soft, NT/ND Neuro:  A&Ox3, CN II-XII intact, normal gait Skin:  Warm, no lesions/ rash   Wt Readings from Last  3 Encounters:  09/30/18 173 lb 11.2 oz (78.8 kg)  06/29/18 177 lb (80.3 kg)  05/19/18 184 lb (83.5 kg)    Lab Results  Component Value Date   WBC 8.6 05/13/2018   HGB 12.0 05/13/2018   HCT 36.0 05/13/2018   PLT 285 05/13/2018   GLUCOSE 96 05/13/2018   CHOL 115 06/08/2012   TRIG 53.0 06/08/2012   HDL 68.20 06/08/2012   LDLCALC 36 06/08/2012   ALT 35 05/13/2018   AST 32 05/13/2018   NA 140 05/13/2018   K 3.0 (L) 05/13/2018   CL 108 05/13/2018   CREATININE 0.80 05/13/2018   BUN 6 05/13/2018   CO2 20 (L) 05/13/2018   TSH 1.40 06/08/2012    Assessment/Plan:  Viral URI with cough -supportive care -ok to use Tylenol or ibuprofen prn for pain/discomfort -given handout  -f/u prn  Mild intermittent asthma without complication  - Plan: albuterol (PROVENTIL HFA;VENTOLIN HFA) 108 (90 Base) MCG/ACT inhaler  Body aches  - Plan: POC Influenza A/B  Negative  F/u prn.  Pt to have TOC visit tomorrow with Dr. Jerilee Hoh.  Grier Mitts, MD

## 2018-10-12 NOTE — Patient Instructions (Signed)
Upper Respiratory Infection, Adult An upper respiratory infection (URI) is a common viral infection of the nose, throat, and upper air passages that lead to the lungs. The most common type of URI is the common cold. URIs usually get better on their own, without medical treatment. What are the causes? A URI is caused by a virus. You may catch a virus by:  Breathing in droplets from an infected person's cough or sneeze.  Touching something that has been exposed to the virus (contaminated) and then touching your mouth, nose, or eyes. What increases the risk? You are more likely to get a URI if:  You are very young or very old.  It is autumn or winter.  You have close contact with others, such as at a daycare, school, or health care facility.  You smoke.  You have long-term (chronic) heart or lung disease.  You have a weakened disease-fighting (immune) system.  You have nasal allergies or asthma.  You are experiencing a lot of stress.  You work in an area that has poor air circulation.  You have poor nutrition. What are the signs or symptoms? A URI usually involves some of the following symptoms:  Runny or stuffy (congested) nose.  Sneezing.  Cough.  Sore throat.  Headache.  Fatigue.  Fever.  Loss of appetite.  Pain in your forehead, behind your eyes, and over your cheekbones (sinus pain).  Muscle aches.  Redness or irritation of the eyes.  Pressure in the ears or face. How is this diagnosed? This condition may be diagnosed based on your medical history and symptoms, and a physical exam. Your health care provider may use a cotton swab to take a mucus sample from your nose (nasal swab). This sample can be tested to determine what virus is causing the illness. How is this treated? URIs usually get better on their own within 7-10 days. You can take steps at home to relieve your symptoms. Medicines cannot cure URIs, but your health care provider may recommend  certain medicines to help relieve symptoms, such as:  Over-the-counter cold medicines.  Cough suppressants. Coughing is a type of defense against infection that helps to clear the respiratory system, so take these medicines only as recommended by your health care provider.  Fever-reducing medicines. Follow these instructions at home: Activity  Rest as needed.  If you have a fever, stay home from work or school until your fever is gone or until your health care provider says you are no longer contagious. Your health care provider may have you wear a face mask to prevent your infection from spreading. Relieving symptoms  Gargle with a salt-water mixture 3-4 times a day or as needed. To make a salt-water mixture, completely dissolve -1 tsp of salt in 1 cup of warm water.  Use a cool-mist humidifier to add moisture to the air. This can help you breathe more easily. Eating and drinking   Drink enough fluid to keep your urine pale yellow.  Eat soups and other clear broths. General instructions   Take over-the-counter and prescription medicines only as told by your health care provider. These include cold medicines, fever reducers, and cough suppressants.  Do not use any products that contain nicotine or tobacco, such as cigarettes and e-cigarettes. If you need help quitting, ask your health care provider.  Stay away from secondhand smoke.  Stay up to date on all immunizations, including the yearly (annual) flu vaccine.  Keep all follow-up visits as told by your health   care provider. This is important. How to prevent the spread of infection to others   URIs can be passed from person to person (are contagious). To prevent the infection from spreading: ? Wash your hands often with soap and water. If soap and water are not available, use hand sanitizer. ? Avoid touching your mouth, face, eyes, or nose. ? Cough or sneeze into a tissue or your sleeve or elbow instead of into your hand  or into the air. Contact a health care provider if:  You are getting worse instead of better.  You have a fever or chills.  Your mucus is brown or red.  You have yellow or brown discharge coming from your nose.  You have pain in your face, especially when you bend forward.  You have swollen neck glands.  You have pain while swallowing.  You have white areas in the back of your throat. Get help right away if:  You have shortness of breath that gets worse.  You have severe or persistent: ? Headache. ? Ear pain. ? Sinus pain. ? Chest pain.  You have chronic lung disease along with any of the following: ? Wheezing. ? Prolonged cough. ? Coughing up blood. ? A change in your usual mucus.  You have a stiff neck.  You have changes in your: ? Vision. ? Hearing. ? Thinking. ? Mood. Summary  An upper respiratory infection (URI) is a common infection of the nose, throat, and upper air passages that lead to the lungs.  A URI is caused by a virus.  URIs usually get better on their own within 7-10 days.  Medicines cannot cure URIs, but your health care provider may recommend certain medicines to help relieve symptoms. This information is not intended to replace advice given to you by your health care provider. Make sure you discuss any questions you have with your health care provider. Document Released: 01/21/2001 Document Revised: 03/13/2017 Document Reviewed: 03/13/2017 Elsevier Interactive Patient Education  2019 [employer].  Asthma, Adult  Asthma is a long-term (chronic) condition in which the airways get tight and narrow. The airways are the breathing passages that lead from the nose and mouth down into the lungs. A person with asthma will have times when symptoms get worse. These are called asthma attacks. They can cause coughing, whistling sounds when you breathe (wheezing), shortness of breath, and chest pain. They can make it hard to breathe. There is no cure for  asthma, but medicines and lifestyle changes can help control it. There are many things that can bring on an asthma attack or make asthma symptoms worse (triggers). Common triggers include:  Mold.  Dust.  Cigarette smoke.  Cockroaches.  Things that can cause allergy symptoms (allergens). These include animal skin flakes (dander) and pollen from trees or grass.  Things that pollute the air. These may include household cleaners, wood smoke, smog, or chemical odors.  Cold air, weather changes, and wind.  Crying or laughing hard.  Stress.  Certain medicines or drugs.  Certain foods such as dried fruit, potato chips, and grape juice.  Infections, such as a cold or the flu.  Certain medical conditions or diseases.  Exercise or tiring activities. Asthma may be treated with medicines and by staying away from the things that cause asthma attacks. Types of medicines may include:  Controller medicines. These help prevent asthma symptoms. They are usually taken every day.  Fast-acting reliever or rescue medicines. These quickly relieve asthma symptoms. They are used as  needed and provide short-term relief.  Allergy medicines if your attacks are brought on by allergens.  Medicines to help control the body's defense (immune) system. Follow these instructions at home: Avoiding triggers in your home  Change your heating and air conditioning filter often.  Limit your use of fireplaces and wood stoves.  Get rid of pests (such as roaches and mice) and their droppings.  Throw away plants if you see mold on them.  Clean your floors. Dust regularly. Use cleaning products that do not smell.  Have someone vacuum when you are not home. Use a vacuum cleaner with a HEPA filter if possible.  Replace carpet with wood, tile, or vinyl flooring. Carpet can trap animal skin flakes and dust.  Use allergy-proof pillows, mattress covers, and box spring covers.  Wash bed sheets and blankets every  week in hot water. Dry them in a dryer.  Keep your bedroom free of any triggers.  Avoid pets and keep windows closed when things that cause allergy symptoms are in the air.  Use blankets that are made of polyester or cotton.  Clean bathrooms and kitchens with bleach. If possible, have someone repaint the walls in these rooms with mold-resistant paint. Keep out of the rooms that are being cleaned and painted.  Wash your hands often with soap and water. If soap and water are not available, use hand sanitizer.  Do not allow anyone to smoke in your home. General instructions  Take over-the-counter and prescription medicines only as told by your doctor. ? Talk with your doctor if you have questions about how or when to take your medicines. ? Make note if you need to use your medicines more often than usual.  Do not use any products that contain nicotine or tobacco, such as cigarettes and e-cigarettes. If you need help quitting, ask your doctor.  Stay away from secondhand smoke.  Avoid doing things outdoors when allergen counts are high and when air quality is low.  Wear a ski mask when doing outdoor activities in the winter. The mask should cover your nose and mouth. Exercise indoors on cold days if you can.  Warm up before you exercise. Take time to cool down after exercise.  Use a peak flow meter as told by your doctor. A peak flow meter is a tool that measures how well the lungs are working.  Keep track of the peak flow meter's readings. Write them down.  Follow your asthma action plan. This is a written plan for taking care of your asthma and treating your attacks.  Make sure you get all the shots (vaccines) that your doctor recommends. Ask your doctor about a flu shot and a pneumonia shot.  Keep all follow-up visits as told by your doctor. This is important. Contact a doctor if:  You have wheezing, shortness of breath, or a cough even while taking medicine to prevent  attacks.  The mucus you cough up (sputum) is thicker than usual.  The mucus you cough up changes from clear or white to yellow, green, gray, or bloody.  You have problems from the medicine you are taking, such as: ? A rash. ? Itching. ? Swelling. ? Trouble breathing.  You need reliever medicines more than 2-3 times a week.  Your peak flow reading is still at 50-79% of your personal best after following the action plan for 1 hour.  You have a fever. Get help right away if:  You seem to be worse and are not responding  to medicine during an asthma attack.  You are short of breath even at rest.  You get short of breath when doing very little activity.  You have trouble eating, drinking, or talking.  You have chest pain or tightness.  You have a fast heartbeat.  Your lips or fingernails start to turn blue.  You are light-headed or dizzy, or you faint.  Your peak flow is less than 50% of your personal best.  You feel too tired to breathe normally. Summary  Asthma is a long-term (chronic) condition in which the airways get tight and narrow. An asthma attack can make it hard to breathe.  Asthma cannot be cured, but medicines and lifestyle changes can help control it.  Make sure you understand how to avoid triggers and how and when to use your medicines. This information is not intended to replace advice given to you by your health care provider. Make sure you discuss any questions you have with your health care provider. Document Released: 01/14/2008 Document Revised: 09/01/2016 Document Reviewed: 09/01/2016 Elsevier Interactive Patient Education  2019 [employer].

## 2018-10-13 ENCOUNTER — Encounter: Payer: 59 | Admitting: Internal Medicine

## 2018-11-04 ENCOUNTER — Encounter: Payer: 59 | Admitting: Internal Medicine

## 2018-11-07 ENCOUNTER — Other Ambulatory Visit: Payer: Self-pay | Admitting: Family Medicine

## 2018-11-07 DIAGNOSIS — J4520 Mild intermittent asthma, uncomplicated: Secondary | ICD-10-CM

## 2019-03-30 ENCOUNTER — Ambulatory Visit (INDEPENDENT_AMBULATORY_CARE_PROVIDER_SITE_OTHER): Payer: 59 | Admitting: Family Medicine

## 2019-03-30 ENCOUNTER — Other Ambulatory Visit: Payer: Self-pay

## 2019-03-30 ENCOUNTER — Encounter: Payer: Self-pay | Admitting: Family Medicine

## 2019-03-30 VITALS — BP 110/76 | HR 80 | Temp 98.2°F | Ht 67.0 in | Wt 166.4 lb

## 2019-03-30 DIAGNOSIS — Z114 Encounter for screening for human immunodeficiency virus [HIV]: Secondary | ICD-10-CM

## 2019-03-30 DIAGNOSIS — Z0000 Encounter for general adult medical examination without abnormal findings: Secondary | ICD-10-CM

## 2019-03-30 DIAGNOSIS — Z0001 Encounter for general adult medical examination with abnormal findings: Secondary | ICD-10-CM | POA: Diagnosis not present

## 2019-03-30 DIAGNOSIS — F419 Anxiety disorder, unspecified: Secondary | ICD-10-CM

## 2019-03-30 MED ORDER — FLUOXETINE HCL 20 MG PO TABS: 20.0000 mg | ORAL_TABLET | Freq: Every day | ORAL | 1 refills | Status: DC
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
## 2019-03-30 NOTE — Patient Instructions (Signed)
Starting prozac daily. Low dose. Will see you back in one month to see if this helps anxiety.   Routine labs So nice to mee tyou!   Generalized Anxiety Disorder, Adult Generalized anxiety disorder (GAD) is a mental health disorder. People with this condition constantly worry about everyday events. Unlike normal anxiety, worry related to GAD is not triggered by a specific event. These worries also do not fade or get better with time. GAD interferes with life functions, including relationships, work, and school. GAD can vary from mild to severe. People with severe GAD can have intense waves of anxiety with physical symptoms (panic attacks). What are the causes? The exact cause of GAD is not known. What increases the risk? This condition is more likely to develop in:  Women.  People who have a family history of anxiety disorders.  People who are very shy.  People who experience very stressful life events, such as the death of a loved one.  People who have a very stressful family environment. What are the signs or symptoms? People with GAD often worry excessively about many things in their lives, such as their health and family. They may also be overly concerned about:  Doing well at work.  Being on time.  Natural disasters.  Friendships. Physical symptoms of GAD include:  Fatigue.  Muscle tension or having muscle twitches.  Trembling or feeling shaky.  Being easily startled.  Feeling like your heart is pounding or racing.  Feeling out of breath or like you cannot take a deep breath.  Having trouble falling asleep or staying asleep.  Sweating.  Nausea, diarrhea, or irritable bowel syndrome (IBS).  Headaches.  Trouble concentrating or remembering facts.  Restlessness.  Irritability. How is this diagnosed? Your health care provider can diagnose GAD based on your symptoms and medical history. You will also have a physical exam. The health care provider will ask  specific questions about your symptoms, including how severe they are, when they started, and if they come and go. Your health care provider may ask you about your use of alcohol or drugs, including prescription medicines. Your health care provider may refer you to a mental health specialist for further evaluation. Your health care provider will do a thorough examination and may perform additional tests to rule out other possible causes of your symptoms. To be diagnosed with GAD, a person must have anxiety that:  Is out of his or her control.  Affects several different aspects of his or her life, such as work and relationships.  Causes distress that makes him or her unable to take part in normal activities.  Includes at least three physical symptoms of GAD, such as restlessness, fatigue, trouble concentrating, irritability, muscle tension, or sleep problems. Before your health care provider can confirm a diagnosis of GAD, these symptoms must be present more days than they are not, and they must last for six months or longer. How is this treated? The following therapies are usually used to treat GAD:  Medicine. Antidepressant medicine is usually prescribed for long-term daily control. Antianxiety medicines may be added in severe cases, especially when panic attacks occur.  Talk therapy (psychotherapy). Certain types of talk therapy can be helpful in treating GAD by providing support, education, and guidance. Options include: ? Cognitive behavioral therapy (CBT). People learn coping skills and techniques to ease their anxiety. They learn to identify unrealistic or negative thoughts and behaviors and to replace them with positive ones. ? Acceptance and commitment therapy (ACT).  This treatment teaches people how to be mindful as a way to cope with unwanted thoughts and feelings. ? Biofeedback. This process trains you to manage your body's response (physiological response) through breathing techniques  and relaxation methods. You will work with a therapist while machines are used to monitor your physical symptoms.  Stress management techniques. These include yoga, meditation, and exercise. A mental health specialist can help determine which treatment is best for you. Some people see improvement with one type of therapy. However, other people require a combination of therapies. Follow these instructions at home:  Take over-the-counter and prescription medicines only as told by your health care provider.  Try to maintain a normal routine.  Try to anticipate stressful situations and allow extra time to manage them.  Practice any stress management or self-calming techniques as taught by your health care provider.  Do not punish yourself for setbacks or for not making progress.  Try to recognize your accomplishments, even if they are small.  Keep all follow-up visits as told by your health care provider. This is important. Contact a health care provider if:  Your symptoms do not get better.  Your symptoms get worse.  You have signs of depression, such as: ? A persistently sad, cranky, or irritable mood. ? Loss of enjoyment in activities that used to bring you joy. ? Change in weight or eating. ? Changes in sleeping habits. ? Avoiding friends or family members. ? Loss of energy for normal tasks. ? Feelings of guilt or worthlessness. Get help right away if:  You have serious thoughts about hurting yourself or others. If you ever feel like you may hurt yourself or others, or have thoughts about taking your own life, get help right away. You can go to your nearest emergency department or call:  Your local emergency services (911 in the [place]  A suicide crisis helpline, such as the [place] at 765-709-0906. This is open 24 hours a day. Summary  Generalized anxiety disorder (GAD) is a mental health disorder that involves worry that is not triggered by  a specific event.  People with GAD often worry excessively about many things in their lives, such as their health and family.  GAD may cause physical symptoms such as restlessness, trouble concentrating, sleep problems, frequent sweating, nausea, diarrhea, headaches, and trembling or muscle twitching.  A mental health specialist can help determine which treatment is best for you. Some people see improvement with one type of therapy. However, other people require a combination of therapies. This information is not intended to replace advice given to you by your health care provider. Make sure you discuss any questions you have with your health care provider. Document Released: 11/22/2012 Document Revised: 07/10/2017 Document Reviewed: 06/17/2016 Elsevier Patient Education  2020 [employer].

## 2019-03-31 ENCOUNTER — Other Ambulatory Visit: Payer: Self-pay | Admitting: Family Medicine

## 2019-03-31 ENCOUNTER — Encounter: Payer: Self-pay | Admitting: Family Medicine

## 2019-03-31 LAB — HIV ANTIBODY (ROUTINE TESTING W REFLEX): HIV 1&2 Ab, 4th Generation: NONREACTIVE

## 2019-03-31 LAB — COMPREHENSIVE METABOLIC PANEL WITH GFR
ALT: 10 U/L (ref 0–35)
AST: 13 U/L (ref 0–37)
Albumin: 4.3 g/dL (ref 3.5–5.2)
Alkaline Phosphatase: 53 U/L (ref 39–117)
BUN: 10 mg/dL (ref 6–23)
CO2: 23 meq/L (ref 19–32)
Calcium: 9.4 mg/dL (ref 8.4–10.5)
Chloride: 104 meq/L (ref 96–112)
Creatinine, Ser: 0.78 mg/dL (ref 0.40–1.20)
GFR: 87.73 mL/min
Glucose, Bld: 125 mg/dL — ABNORMAL HIGH (ref 70–99)
Potassium: 3.7 meq/L (ref 3.5–5.1)
Sodium: 139 meq/L (ref 135–145)
Total Bilirubin: 0.3 mg/dL (ref 0.2–1.2)
Total Protein: 7.2 g/dL (ref 6.0–8.3)

## 2019-03-31 LAB — CBC WITH DIFFERENTIAL/PLATELET
Basophils Absolute: 0 10*3/uL (ref 0.0–0.1)
Basophils Relative: 0.5 % (ref 0.0–3.0)
Eosinophils Absolute: 0.1 10*3/uL (ref 0.0–0.7)
Eosinophils Relative: 0.8 % (ref 0.0–5.0)
HCT: 36.9 % (ref 36.0–46.0)
Hemoglobin: 12.3 g/dL (ref 12.0–15.0)
Lymphocytes Relative: 29.9 % (ref 12.0–46.0)
Lymphs Abs: 2.4 10*3/uL (ref 0.7–4.0)
MCHC: 33.3 g/dL (ref 30.0–36.0)
MCV: 87.4 fl (ref 78.0–100.0)
Monocytes Absolute: 0.4 10*3/uL (ref 0.1–1.0)
Monocytes Relative: 5.4 % (ref 3.0–12.0)
Neutro Abs: 5.1 10*3/uL (ref 1.4–7.7)
Neutrophils Relative %: 63.4 % (ref 43.0–77.0)
Platelets: 321 10*3/uL (ref 150.0–400.0)
RBC: 4.22 Mil/uL (ref 3.87–5.11)
RDW: 14.3 % (ref 11.5–15.5)
WBC: 8 10*3/uL (ref 4.0–10.5)

## 2019-03-31 LAB — LIPID PANEL
Cholesterol: 121 mg/dL (ref 0–200)
HDL: 68.1 mg/dL (ref >39.00)
LDL Cholesterol: 31 mg/dL (ref 0–99)
NonHDL: 53.23
Total CHOL/HDL Ratio: 2
Triglycerides: 109 mg/dL (ref 0.0–149.0)
VLDL: 21.8 mg/dL (ref 0.0–40.0)

## 2019-03-31 LAB — TSH: TSH: 1.56 u[IU]/mL (ref 0.35–4.50)

## 2019-04-13 ENCOUNTER — Encounter: Payer: Self-pay | Admitting: Family Medicine

## 2019-04-14 ENCOUNTER — Ambulatory Visit (INDEPENDENT_AMBULATORY_CARE_PROVIDER_SITE_OTHER): Payer: 59 | Admitting: Family Medicine

## 2019-04-14 ENCOUNTER — Encounter: Payer: Self-pay | Admitting: Family Medicine

## 2019-04-14 VITALS — Ht 67.0 in | Wt 166.0 lb

## 2019-04-14 DIAGNOSIS — F419 Anxiety disorder, unspecified: Secondary | ICD-10-CM | POA: Diagnosis not present

## 2019-04-14 MED ORDER — SERTRALINE HCL 50 MG PO TABS: 50.0000 mg | ORAL_TABLET | Freq: Every day | ORAL | 3 refills | Status: DC

## 2019-04-14 NOTE — Progress Notes (Signed)
Patient: Erin Pratt MRN: [id number] DOB: 21-Mar-1991 PCP: [name], MD     I connected with Erin Pratt on 04/14/19 by a video at 8:18am enabled telemedicine application and verified that I am speaking with the correct person using two identifiers.  Location patient: Home Location provider: [facility], Office Persons participating in this virtual visit: [place]  I discussed the limitations of evaluation and management by telemedicine and the availability of in person appointments. The patient expressed understanding and agreed to proceed.   Subjective:  Chief Complaint  Patient presents with  . reaction to medication    HPI: The patient is a 28 y.o. female who presents today for reaction to her prozac that was started on 03/30/2019. She states that when she started the medication she started to have palpitations and feel like her heart was racing. She does check her heart rate and it's not fast. She also has loss of appetite. She does think it has helped her overall anxiety. Has not helped her sleep either. Interested in a different medication. Still in counseling.   Review of Systems  Constitutional: Negative for fatigue.  Eyes: Negative for visual disturbance.  Respiratory: Positive for chest tightness and shortness of breath.   Cardiovascular: Positive for chest pain and palpitations. Negative for leg swelling.  Gastrointestinal: Negative for abdominal pain, diarrhea, nausea and vomiting.  Musculoskeletal: Negative for arthralgias, back pain, myalgias, neck pain and neck stiffness.  Neurological: Positive for dizziness and headaches.  Psychiatric/Behavioral: Positive for sleep disturbance.    Allergies Patient has No Known Allergies.  Past Medical History Patient  has a past medical history of Asthma and Migraine.  Surgical History Patient  has a past surgical history that includes Tonsillectomy; Shoulder surgery (Right, 2009); Elbow surgery (Right, 2008); and  Cesarean section (2017).  Family History Pateint's family history includes Arthritis in her maternal grandmother and mother; Asthma in her brother, father, mother, and sister; Cancer - Lung in her maternal grandfather; Hypertension in her maternal grandmother and mother.  Social History Patient  reports that she has never smoked. She has never used smokeless tobacco. She reports that she does not drink alcohol or use drugs.    Objective: Vitals:   04/14/19 0806  Weight: 166 lb (75.3 kg)  Height: 5\' 7"  (1.702 m)    Body mass index is 26 kg/m.  Physical Exam Vitals signs reviewed.  Constitutional:      Appearance: Normal appearance.  HENT:     Head: Normocephalic and atraumatic.  Pulmonary:     Effort: Pulmonary effort is normal.  Neurological:     General: No focal deficit present.     Mental Status: She is alert and oriented to person, place, and time.  Psychiatric:        Mood and Affect: Mood normal.        Behavior: Behavior normal.     Comments: No si/hi/ah/vh         Assessment/plan: 1. Anxiety Side effects from prozac. Discussed another SSRI vs. Buspar. We are going to try zoloft to have some depression coverage as well even though her main issue is anxiety. Starting her on 25mg  then after 7 days can increase to full 50mg . Again discussed side effects of SSRIS. I've explained to her that drugs of the SSRI class can have side effects such as weight gain, sexual dysfunction, insomnia, headache, nausea. These medications are generally effective at alleviating symptoms of anxiety and/or depression. Let me know if significant side effects do occur.  F/u in one month or sooner if needed.  Return in about 1 month (around 05/14/2019) for anxiety. needs gad 7.     [name], MD [place]  04/14/2019

## 2019-04-25 ENCOUNTER — Encounter: Payer: Self-pay | Admitting: Family Medicine

## 2019-05-02 ENCOUNTER — Ambulatory Visit: Payer: 59 | Admitting: Family Medicine

## 2019-05-03 ENCOUNTER — Encounter: Payer: Self-pay | Admitting: Family Medicine

## 2019-05-04 ENCOUNTER — Other Ambulatory Visit: Payer: Self-pay

## 2019-05-04 MED ORDER — SERTRALINE HCL 100 MG PO TABS: 100.0000 mg | ORAL_TABLET | Freq: Every day | ORAL | 3 refills | Status: DC

## 2019-05-19 ENCOUNTER — Ambulatory Visit (INDEPENDENT_AMBULATORY_CARE_PROVIDER_SITE_OTHER): Payer: 59 | Admitting: Family Medicine

## 2019-05-19 ENCOUNTER — Other Ambulatory Visit: Payer: Self-pay

## 2019-05-19 ENCOUNTER — Encounter: Payer: Self-pay | Admitting: Family Medicine

## 2019-05-19 VITALS — Ht 67.0 in | Wt 166.0 lb

## 2019-05-19 DIAGNOSIS — G4701 Insomnia due to medical condition: Secondary | ICD-10-CM

## 2019-05-19 DIAGNOSIS — F419 Anxiety disorder, unspecified: Secondary | ICD-10-CM | POA: Diagnosis not present

## 2019-05-19 MED ORDER — TRAZODONE HCL 50 MG PO TABS: ORAL_TABLET | ORAL | 1 refills | Status: DC

## 2019-05-19 MED ORDER — SERTRALINE HCL 50 MG PO TABS: 50.0000 mg | ORAL_TABLET | Freq: Every day | ORAL | 1 refills | Status: DC

## 2019-05-19 MED ORDER — SERTRALINE HCL 100 MG PO TABS: 100.0000 mg | ORAL_TABLET | Freq: Every day | ORAL | 1 refills | Status: DC
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
## 2019-06-08 ENCOUNTER — Ambulatory Visit: Payer: 59 | Admitting: Physical Medicine and Rehabilitation

## 2019-06-10 ENCOUNTER — Other Ambulatory Visit: Payer: Self-pay | Admitting: Family Medicine

## 2019-07-12 ENCOUNTER — Telehealth: Payer: 59 | Admitting: Physician Assistant

## 2019-07-12 DIAGNOSIS — J329 Chronic sinusitis, unspecified: Secondary | ICD-10-CM

## 2019-07-12 MED ORDER — AMOXICILLIN-POT CLAVULANATE 875-125 MG PO TABS: 1.0000 | ORAL_TABLET | Freq: Two times a day (BID) | ORAL | 0 refills | Status: AC

## 2019-07-12 NOTE — Progress Notes (Signed)
We are sorry that you are not feeling well.  Here is how we plan to help!  Based on what you have shared with me it looks like you have sinusitis.  Sinusitis is inflammation and infection in the sinus cavities of the head.  Based on your presentation I believe you most likely have Acute Bacterial Sinusitis.  This is an infection caused by bacteria and is treated with antibiotics. I have prescribed Augmentin 875mg/125mg one tablet twice daily with food, for 7 days. You may use an oral decongestant such as Mucinex D or if you have glaucoma or high blood pressure use plain Mucinex. Saline nasal spray help and can safely be used as often as needed for congestion.  If you develop worsening sinus pain, fever or notice severe headache and vision changes, or if symptoms are not better after completion of antibiotic, please schedule an appointment with a health care provider.    Sinus infections are not as easily transmitted as other respiratory infection, however we still recommend that you avoid close contact with loved ones, especially the very young and elderly.  Remember to wash your hands thoroughly throughout the day as this is the number one way to prevent the spread of infection!  Home Care:  Only take medications as instructed by your medical team.  Complete the entire course of an antibiotic.  Do not take these medications with alcohol.  A steam or ultrasonic humidifier can help congestion.  You can place a towel over your head and breathe in the steam from hot water coming from a faucet.  Avoid close contacts especially the very young and the elderly.  Cover your mouth when you cough or sneeze.  Always remember to wash your hands.  Get Help Right Away If:  You develop worsening fever or sinus pain.  You develop a severe head ache or visual changes.  Your symptoms persist after you have completed your treatment plan.  Make sure you  Understand these instructions.  Will watch your  condition.  Will get help right away if you are not doing well or get worse.  Your e-visit answers were reviewed by a board certified advanced clinical practitioner to complete your personal care plan.  Depending on the condition, your plan could have included both over the counter or prescription medications.  If there is a problem please reply  once you have received a response from your provider.  Your safety is important to us.  If you have drug allergies check your prescription carefully.    You can use MyChart to ask questions about today's visit, request a non-urgent call back, or ask for a work or school excuse for 24 hours related to this e-Visit. If it has been greater than 24 hours you will need to follow up with your provider, or enter a new e-Visit to address those concerns.  You will get an e-mail in the next two days asking about your experience.  I hope that your e-visit has been valuable and will speed your recovery. Thank you for using e-visits.   Greater than 5 minutes, yet less than 10 minutes of time have been spent researching, coordinating and implementing care for this patient today.   

## 2019-07-14 ENCOUNTER — Encounter: Payer: Self-pay | Admitting: Physician Assistant

## 2019-07-14 ENCOUNTER — Ambulatory Visit (INDEPENDENT_AMBULATORY_CARE_PROVIDER_SITE_OTHER): Payer: 59 | Admitting: Physician Assistant

## 2019-07-14 DIAGNOSIS — R05 Cough: Secondary | ICD-10-CM

## 2019-07-14 DIAGNOSIS — R059 Cough, unspecified: Secondary | ICD-10-CM

## 2019-07-14 LAB — NOVEL CORONAVIRUS, NAA: SARS-CoV-2, NAA: NEGATIVE

## 2019-07-14 MED ORDER — OFLOXACIN 0.3 % OT SOLN: 5.0000 [drp] | Freq: Every day | OTIC | 0 refills | Status: DC

## 2019-07-14 MED ORDER — PREDNISONE 20 MG PO TABS: 40.0000 mg | ORAL_TABLET | Freq: Every day | ORAL | 0 refills | Status: DC

## 2019-07-14 NOTE — Progress Notes (Signed)
Virtual Visit via Video   I connected with Erin Pratt on 07/14/19 at  9:20 AM EST by a video enabled telemedicine application and verified that I am speaking with the correct person using two identifiers. Location patient: Home Location provider: [facility], Office Persons participating in the virtual visit: Erin Pratt, Erin Coke PA-C  I discussed the limitations of evaluation and management by telemedicine and the availability of in person appointments. The patient expressed understanding and agreed to proceed.  Subjective:   HPI:   Patient is requesting evaluation for possible COVID-19.  She did an e-visit on 07/12/19 and was diagnosed with sinusitis, received Augmentin. Currently on day 3 of antibiotics. Feels like her symptoms are getting worse with time. She states that she is having swelling to her outer ear canal and cannot put a q-tip in as far as she usually can.  Symptom onset: 6 days  Travel/contacts: none   Patient endorses the following symptoms: sinus headache, sinus congestion, sinus pain, sore throat and productive cough (green/yellow); diarrhea (possibly associated with antibiotic, per patient); swallowing hurts when eating, has tightness when taking a deep breath  Patient denies the following symptoms: subjective fever, wheezing, shortness of breath, chest pain and myalgia  Treatments tried: antibiotics and ibuprofen  Patient risk factors: Current [id number] risk of complications score: 1 Smoking status: Erin Pratt  reports that she has never smoked. She has never used smokeless tobacco. If female, currently pregnant? []   Yes [x]   No  ROS: See pertinent positives and negatives per HPI.  Patient Active Problem List   Diagnosis Date Noted  . Dysplastic nevus of trunk 07/19/2012  . Headache, migraine, intractable 10/21/2011  . Urticaria 10/01/2011  . DUB (dysfunctional uterine bleeding) 07/01/2011    Social History   Tobacco Use  .  Smoking status: Never Smoker  . Smokeless tobacco: Never Used  Substance Use Topics  . Alcohol use: No    Current Outpatient Medications:  .  albuterol (PROVENTIL HFA;VENTOLIN HFA) 108 (90 Base) MCG/ACT inhaler, INHALE 2 PUFFS INTO THE LUNGS EVERY 4 (FOUR) HOURS AS NEEDED FOR WHEEZING OR SHORTNESS OF BREATH., Disp: 6.7 Inhaler, Rfl: 0 .  amoxicillin-clavulanate (AUGMENTIN) 875-125 MG tablet, Take 1 tablet by mouth 2 (two) times daily for 7 days., Disp: 14 tablet, Rfl: 0 .  cetirizine (ZYRTEC) 10 MG tablet, Take 10 mg by mouth daily as needed for allergies., Disp: , Rfl:  .  sertraline (ZOLOFT) 100 MG tablet, Take 1 tablet (100 mg total) by mouth daily., Disp: 90 tablet, Rfl: 1 .  sertraline (ZOLOFT) 50 MG tablet, Take 1 tablet (50 mg total) by mouth daily., Disp: 90 tablet, Rfl: 1 .  traZODone (DESYREL) 50 MG tablet, TAKE 1/2 TABLET BY MOUTH AS NEEDED AT BEDTIME FOR SLEEP MAY INCREASE TO 1 TAB IF NEEDED, Disp: 90 tablet, Rfl: 1 .  TRI-LO-MARZIA 0.18/0.215/0.25 MG-25 MCG tab, Take 1 tablet by mouth daily., Disp: , Rfl: 11 .  ofloxacin (FLOXIN OTIC) 0.3 % OTIC solution, Place 5 drops into the right ear daily., Disp: 5 mL, Rfl: 0 .  predniSONE (DELTASONE) 20 MG tablet, Take 2 tablets (40 mg total) by mouth daily., Disp: 10 tablet, Rfl: 0  Allergies  Allergen Reactions  . Other     Objective:   VITALS: Per patient if applicable, see vitals. GENERAL: Alert, appears well and in no acute distress. HEENT: Atraumatic, conjunctiva clear, no obvious abnormalities on inspection of external nose and ears. NECK: Normal movements of the head and neck.  CARDIOPULMONARY: No increased WOB. Speaking in clear sentences. I:E ratio WNL.  MS: Moves all visible extremities without noticeable abnormality. PSYCH: Pleasant and cooperative, well-groomed. Speech normal rate and rhythm. Affect is appropriate. Insight and judgement are appropriate. Attention is focused, linear, and appropriate.  NEURO: CN grossly  intact. Oriented as arrived to appointment on time with no prompting. Moves both UE equally.  SKIN: No obvious lesions, wounds, erythema, or cyanosis noted on face or hands.  Assessment and Plan:   Diagnoses and all orders for this visit:  Cough -     Novel Coronavirus, NAA ([facility])  Other orders -     predniSONE (DELTASONE) 20 MG tablet; Take 2 tablets (40 mg total) by mouth daily. -     ofloxacin (FLOXIN OTIC) 0.3 % OTIC solution; Place 5 drops into the right ear daily.   Patient has a respiratory illness without signs of acute distress or respiratory compromise at this time.   Continue augmentin and ibuprofen. Discussed that we can trial floxin otic drops for her ear. I also recommended that we consider prednisone if covid test is negative and if symptoms do not improve with the drops.   We are going to send patient for drive-up testing. As a precaution, they have been advised to remain home until COVID-19 results and then possible further quarantine after that based on results and symptoms. Advised if they experience a "second sickening" or worsening symptoms as the illness progresses, they are to call the office for further instructions or seek emergent evaluation for any severe symptoms.   . Reviewed expectations re: course of current medical issues. . Discussed self-management of symptoms. . Outlined signs and symptoms indicating need for more acute intervention. . Patient verbalized understanding and all questions were answered. Marland Kitchen Health Maintenance issues including appropriate healthy diet, exercise, and smoking avoidance were discussed with patient. . See orders for this visit as documented in the electronic medical record.  I discussed the assessment and treatment plan with the patient. The patient was provided an opportunity to ask questions and all were answered. The patient agreed with the plan and demonstrated an understanding of the instructions.   The patient was  advised to call back or seek an in-person evaluation if the symptoms worsen or if the condition fails to improve as anticipated.   CMA or LPN served as scribe during this visit. History, Physical, and Plan performed by medical provider. The above documentation has been reviewed and is accurate and complete.  [place], [place] 07/14/2019

## 2019-07-20 ENCOUNTER — Ambulatory Visit (INDEPENDENT_AMBULATORY_CARE_PROVIDER_SITE_OTHER): Payer: 59 | Admitting: Physical Medicine and Rehabilitation

## 2019-07-20 ENCOUNTER — Other Ambulatory Visit: Payer: Self-pay

## 2019-07-20 ENCOUNTER — Encounter: Payer: Self-pay | Admitting: Physical Medicine and Rehabilitation

## 2019-07-20 ENCOUNTER — Encounter: Payer: Self-pay | Admitting: Family Medicine

## 2019-07-20 VITALS — BP 110/72 | HR 81 | Ht 67.0 in | Wt 150.0 lb

## 2019-07-20 DIAGNOSIS — G8929 Other chronic pain: Secondary | ICD-10-CM | POA: Diagnosis not present

## 2019-07-20 DIAGNOSIS — M542 Cervicalgia: Secondary | ICD-10-CM

## 2019-07-20 DIAGNOSIS — M25511 Pain in right shoulder: Secondary | ICD-10-CM | POA: Diagnosis not present

## 2019-07-20 DIAGNOSIS — M7918 Myalgia, other site: Secondary | ICD-10-CM | POA: Diagnosis not present

## 2019-07-20 NOTE — Progress Notes (Signed)
 .  Numeric Pain Rating Scale and Functional Assessment Average Pain 9 Pain Right Now 2 My pain is constant, sharp and dull Pain is worse with: bending, standing and some activites Pain improves with: medication   In the last MONTH (on 0-10 scale) has pain interfered with the following?  1. General activity like being  able to carry out your everyday physical activities such as walking, climbing stairs, carrying groceries, or moving a chair?  Rating(7)  2. Relation with others like being able to carry out your usual social activities and roles such as  activities at home, at work and in your community. Rating(7)  3. Enjoyment of life such that you have  been bothered by emotional problems such as feeling anxious, depressed or irritable?  Rating(4)

## 2019-07-25 ENCOUNTER — Encounter: Payer: Self-pay | Admitting: Physical Medicine and Rehabilitation

## 2019-08-15 ENCOUNTER — Encounter: Payer: Self-pay | Admitting: Family Medicine

## 2019-08-17 ENCOUNTER — Encounter: Payer: Self-pay | Admitting: Family Medicine

## 2019-08-18 ENCOUNTER — Encounter: Payer: Self-pay | Admitting: Physical Medicine and Rehabilitation

## 2019-08-18 ENCOUNTER — Ambulatory Visit (INDEPENDENT_AMBULATORY_CARE_PROVIDER_SITE_OTHER): Payer: 59 | Admitting: Physical Medicine and Rehabilitation

## 2019-08-18 ENCOUNTER — Other Ambulatory Visit: Payer: Self-pay

## 2019-08-18 VITALS — BP 115/73 | HR 75 | Ht 67.0 in | Wt 156.0 lb

## 2019-08-18 DIAGNOSIS — M7918 Myalgia, other site: Secondary | ICD-10-CM | POA: Diagnosis not present

## 2019-08-18 DIAGNOSIS — M25511 Pain in right shoulder: Secondary | ICD-10-CM | POA: Diagnosis not present

## 2019-08-18 DIAGNOSIS — G8929 Other chronic pain: Secondary | ICD-10-CM | POA: Diagnosis not present

## 2019-08-18 DIAGNOSIS — M542 Cervicalgia: Secondary | ICD-10-CM | POA: Diagnosis not present

## 2019-08-18 NOTE — Progress Notes (Signed)
 .  Numeric Pain Rating Scale and Functional Assessment Average Pain 7 Pain Right Now 4 My pain is intermittent, sharp and aching Pain is worse with: some activites Pain improves with: rest   In the last MONTH (on 0-10 scale) has pain interfered with the following?  1. General activity like being  able to carry out your everyday physical activities such as walking, climbing stairs, carrying groceries, or moving a chair?  Rating(7)  2. Relation with others like being able to carry out your usual social activities and roles such as  activities at home, at work and in your community. Rating(7)  3. Enjoyment of life such that you have  been bothered by emotional problems such as feeling anxious, depressed or irritable?  Rating(3)
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
## 2019-09-20 ENCOUNTER — Encounter (HOSPITAL_COMMUNITY): Payer: Self-pay | Admitting: Emergency Medicine

## 2019-09-20 ENCOUNTER — Telehealth: Payer: Self-pay | Admitting: Family Medicine

## 2019-09-20 ENCOUNTER — Other Ambulatory Visit: Payer: Self-pay

## 2019-09-20 ENCOUNTER — Encounter: Payer: Self-pay | Admitting: Family Medicine

## 2019-09-20 ENCOUNTER — Emergency Department (HOSPITAL_COMMUNITY): Payer: 59

## 2019-09-20 ENCOUNTER — Emergency Department (HOSPITAL_COMMUNITY)
Admission: EM | Admit: 2019-09-20 | Discharge: 2019-09-20 | Disposition: A | Discharge status: Home / Self Care | Payer: 59 | Attending: Emergency Medicine | Admitting: Emergency Medicine

## 2019-09-20 DIAGNOSIS — R0789 Other chest pain: Secondary | ICD-10-CM | POA: Insufficient documentation

## 2019-09-20 DIAGNOSIS — J45909 Unspecified asthma, uncomplicated: Secondary | ICD-10-CM | POA: Insufficient documentation

## 2019-09-20 DIAGNOSIS — Q676 Pectus excavatum: Secondary | ICD-10-CM | POA: Diagnosis not present

## 2019-09-20 DIAGNOSIS — R079 Chest pain, unspecified: Secondary | ICD-10-CM

## 2019-09-20 LAB — BASIC METABOLIC PANEL
Anion gap: 9 (ref 5–15)
BUN: 12 mg/dL (ref 6–20)
CO2: 24 mmol/L (ref 22–32)
Calcium: 8.9 mg/dL (ref 8.9–10.3)
Chloride: 105 mmol/L (ref 98–111)
Creatinine, Ser: 0.7 mg/dL (ref 0.44–1.00)
GFR calc Af Amer: >60 mL/min (ref >60)
GFR calc non Af Amer: >60 mL/min (ref >60)
Glucose, Bld: 88 mg/dL (ref 70–99)
Potassium: 3.7 mmol/L (ref 3.5–5.1)
Sodium: 138 mmol/L (ref 135–145)

## 2019-09-20 LAB — CBC
HCT: 41 % (ref 36.0–46.0)
Hemoglobin: 13.8 g/dL (ref 12.0–15.0)
MCH: 29.9 pg (ref 26.0–34.0)
MCHC: 33.7 g/dL (ref 30.0–36.0)
MCV: 88.7 fL (ref 80.0–100.0)
Platelets: 253 10*3/uL (ref 150–400)
RBC: 4.62 MIL/uL (ref 3.87–5.11)
RDW: 12.9 % (ref 11.5–15.5)
WBC: 6.8 10*3/uL (ref 4.0–10.5)
nRBC: 0 % (ref 0.0–0.2)

## 2019-09-20 LAB — I-STAT BETA HCG BLOOD, ED (MC, WL, AP ONLY): I-stat hCG, quantitative: <5 m[IU]/mL (ref <5)

## 2019-09-20 LAB — TROPONIN I (HIGH SENSITIVITY): Troponin I (High Sensitivity): 5 ng/L (ref <18)

## 2019-09-20 MED ORDER — SODIUM CHLORIDE 0.9% FLUSH: 3.0000 mL | Freq: Once | INTRAVENOUS | Status: DC

## 2019-09-20 NOTE — ED Triage Notes (Signed)
Pt states she developed CP today. Pt states she is giving plasma twice a week. Pt states her HR has been 130s when she checks in to give plasma. HR 85 at triage.

## 2019-09-20 NOTE — Telephone Encounter (Signed)
Pt called stating she was experiencing chest pain and tachycardia. Transferred pt to triage nurse. Triage nurse advised pt to go to ED.

## 2019-09-20 NOTE — ED Provider Notes (Signed)
[place] EMERGENCY DEPARTMENT Provider Note   CSN: [id number] Arrival date & time: 09/20/19  1053     History Chief Complaint  Patient presents with  . Chest Pain    Erin Pratt is a 29 y.o. female.  29 year old female presents with complaint of chest pain.  Patient reports constant discomfort in her chest for the majority of her life, thought to be related to her asthma, states more notable for the past year, severity waxes and wanes.  Pain is worse with climbing the stairs and feels like an elephant sitting on the chest by the time she gets to the third floor apartment she lives) this has been present for the past year).  Patient also reports donating plasma for the past 2 months, states that at times her heart rate is too fast and she is not eligible to donate.  Patient states that the last time this happened was yesterday, states her heart rate at that time was 112.  Patient has known pectus excavated him, unsure if this is related to her discomfort.  Has medical history of anxiety, takes medication as prescribed.  No other complaints or concerns.        Past Medical History:  Diagnosis Date  . Asthma   . Migraine     Patient Active Problem List   Diagnosis Date Noted  . Dysplastic nevus of trunk 07/19/2012  . Headache, migraine, intractable 10/21/2011  . Urticaria 10/01/2011  . DUB (dysfunctional uterine bleeding) 07/01/2011    Past Surgical History:  Procedure Laterality Date  . CESAREAN SECTION  2017  . ELBOW SURGERY Right 2008  . SHOULDER SURGERY Right 2009  . TONSILLECTOMY       OB History   No obstetric history on file.     Family History  Problem Relation Age of Onset  . Asthma Mother   . Hypertension Mother   . Arthritis Mother   . Asthma Father   . Asthma Sister   . Asthma Brother   . Arthritis Maternal Grandmother        RA  . Hypertension Maternal Grandmother   . Cancer - Lung Maternal Grandfather     Social History    Tobacco Use  . Smoking status: Never Smoker  . Smokeless tobacco: Never Used  Substance Use Topics  . Alcohol use: No  . Drug use: No    Home Medications Prior to Admission medications   Medication Sig Start Date End Date Taking? Authorizing Provider  albuterol (PROVENTIL HFA;VENTOLIN HFA) 108 (90 Base) MCG/ACT inhaler INHALE 2 PUFFS INTO THE LUNGS EVERY 4 (FOUR) HOURS AS NEEDED FOR WHEEZING OR SHORTNESS OF BREATH. 11/08/18   Billie Ruddy, MD  cetirizine (ZYRTEC) 10 MG tablet Take 10 mg by mouth daily as needed for allergies.    [provider]  ofloxacin (FLOXIN OTIC) 0.3 % OTIC solution Place 5 drops into the right ear daily. 07/14/19   Inda Coke, PA  predniSONE (DELTASONE) 20 MG tablet Take 2 tablets (40 mg total) by mouth daily. 07/14/19   Inda Coke, PA  sertraline (ZOLOFT) 100 MG tablet Take 1 tablet (100 mg total) by mouth daily. 05/19/19   [name], MD  sertraline (ZOLOFT) 50 MG tablet Take 1 tablet (50 mg total) by mouth daily. 05/19/19   [name], MD  traZODone (DESYREL) 50 MG tablet TAKE 1/2 TABLET BY MOUTH AS NEEDED AT BEDTIME FOR SLEEP MAY INCREASE TO 1 TAB IF NEEDED 06/10/19   [name],  MD  TRI-LO-MARZIA 0.18/0.215/0.25 MG-25 MCG tab Take 1 tablet by mouth daily. 03/22/18   [provider]    Allergies    Other  Review of Systems   Review of Systems  Constitutional: Negative for fever.  Respiratory: Negative for shortness of breath.   Cardiovascular: Positive for chest pain. Negative for palpitations and leg swelling.  Gastrointestinal: Negative for abdominal pain, nausea and vomiting.  Musculoskeletal: Negative for arthralgias and myalgias.  Skin: Negative for rash and wound.  Allergic/Immunologic: Negative for immunocompromised state.  Neurological: Negative for weakness.  Hematological: Negative for adenopathy.  Psychiatric/Behavioral: Negative for confusion.  All other systems reviewed and are negative.    Physical Exam Updated Vital Signs BP 124/76 (BP Location: Right Arm)   Pulse 87   Temp 98.6 F (37 C) (Oral)   Resp 18   Ht 5\' 7"  (1.702 m)   Wt 68 kg   LMP 09/05/2019   SpO2 100%   BMI 23.49 kg/m   Physical Exam Vitals and nursing note reviewed.  Constitutional:      General: She is not in acute distress.    Appearance: She is well-developed. She is not diaphoretic.  HENT:     Head: Normocephalic and atraumatic.  Cardiovascular:     Rate and Rhythm: Normal rate and regular rhythm.     Heart sounds: Normal heart sounds.  Pulmonary:     Effort: Pulmonary effort is normal.     Breath sounds: Normal breath sounds.  Chest:     Chest wall: Deformity present. No tenderness.  Abdominal:     Palpations: Abdomen is soft.  Musculoskeletal:     Right lower leg: No edema.     Left lower leg: No edema.  Skin:    General: Skin is warm and dry.     Findings: No rash.  Neurological:     Mental Status: She is alert and oriented to person, place, and time.  Psychiatric:        Behavior: Behavior normal.     ED Results / Procedures / Treatments   Labs (all labs ordered are listed, but only abnormal results are displayed) Labs Reviewed  BASIC METABOLIC PANEL  CBC  I-STAT BETA HCG BLOOD, ED ([facility], [facility], [facility] ONLY)  TROPONIN I (HIGH SENSITIVITY)    EKG None  Radiology DG Chest 2 View  Result Date: 09/20/2019 CLINICAL DATA:  Chronic progressive chest pain. EXAM: CHEST - 2 VIEW COMPARISON:  Chest x-ray dated 05/13/2018 FINDINGS: The heart size and mediastinal contours are within normal limits. Both lungs are clear. Slight pectus excavatum deformity. IMPRESSION: No acute disease. Electronically Signed   By: Lorriane Shire M.D.   On: 09/20/2019 11:34    Procedures Procedures (including critical care time)  Medications Ordered in ED Medications  sodium chloride flush (NS) 0.9 % injection 3 mL (3 mLs Intravenous Not Given 09/20/19 1225)    ED Course  I have reviewed the triage  vital signs and the nursing notes.  Pertinent labs & imaging results that were available during my care of the patient were reviewed by me and considered in my medical decision making (see chart for details).  Clinical Course as of Sep 19 1240  Tue Feb 09, 641  [id number] 29 year old female with chest pain x1+ years, no acute complaints at this time.  Vitals reviewed, heart rate within normal limits and time evaluation.  EKG without acute ischemic changes.  Chest x-ray reviewed, does show known pectus excavating.  Recommend patient follow-up  with PCP, consider event monitoring versus stress test.  Also recommend patient increase hydration, tachycardia since beginning of this year may be related to her recent plasma donations.   [LM]    Clinical Course User Index [LM] Roque Lias   MDM Rules/Calculators/A&P                      Final Clinical Impression(s) / ED Diagnoses Final diagnoses:  Chest pain, unspecified type    Rx / DC Orders ED Discharge Orders    None       Tacy Learn, PA-C 09/20/19 1242    Pattricia Boss, MD 09/20/19 [phone]

## 2019-09-20 NOTE — Progress Notes (Signed)
Pt next visit is 09/28/2019. Pt is requesting a sooner appointment for heart monitor. Referral is in for Cardiology. Please Advise.

## 2019-09-20 NOTE — Telephone Encounter (Signed)
Pt next visit is 09/28/2019. Pt is requesting a sooner appointment, for stress test and heart monitor. Referral is in for Cardiology. Please Advise.

## 2019-09-20 NOTE — Telephone Encounter (Signed)
FYI

## 2019-09-20 NOTE — Discharge Instructions (Addendum)
Recommend follow-up with your doctor as discussed, may need stress test or cardiac monitoring.  Your work-up today in the ER is reassuring, chest x-ray does show known pectus excavated him otherwise unremarkable.  Labs reassuring.

## 2019-09-21 NOTE — Telephone Encounter (Signed)
LVM for patient to call the office back. 

## 2019-09-23 ENCOUNTER — Encounter: Payer: Self-pay | Admitting: Cardiovascular Disease

## 2019-09-23 ENCOUNTER — Other Ambulatory Visit: Payer: Self-pay

## 2019-09-23 ENCOUNTER — Ambulatory Visit (INDEPENDENT_AMBULATORY_CARE_PROVIDER_SITE_OTHER): Payer: 59 | Admitting: Cardiovascular Disease

## 2019-09-23 VITALS — BP 102/62 | HR 91 | Ht 67.0 in | Wt 154.6 lb

## 2019-09-23 DIAGNOSIS — R0602 Shortness of breath: Secondary | ICD-10-CM | POA: Diagnosis not present

## 2019-09-23 DIAGNOSIS — R079 Chest pain, unspecified: Secondary | ICD-10-CM | POA: Diagnosis not present

## 2019-09-23 NOTE — Patient Instructions (Signed)
Medication Instructions:  The current medical regimen is effective;  continue present plan and medications.  *If you need a refill on your cardiac medications before your next appointment, please call your pharmacy*  Lab Work: TSH today  If you have labs (blood work) drawn today and your tests are completely normal, you will receive your results only by: Marland Kitchen MyChart Message (if you have MyChart) OR . A paper copy in the mail If you have any lab test that is abnormal or we need to change your treatment, we will call you to review the results.  Testing/Procedures: Your physician has requested that you have a stress echocardiogram. For further information please visit [email or web address]. Please follow instruction sheet as given.  Follow-Up: At [facility], you and your health needs are our priority.  As part of our continuing mission to provide you with exceptional heart care, we have created designated Provider Care Teams.  These Care Teams include your primary Cardiologist (physician) and Advanced Practice Providers (APPs -  Physician Assistants and Nurse Practitioners) who all work together to provide you with the care you need, when you need it.  Your next appointment:   1 month(s)  The format for your next appointment:   Virtual Visit   Provider:   Eleonore Chiquito, MD
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
## 2019-09-24 LAB — TSH: TSH: 1.82 u[IU]/mL (ref 0.450–4.500)

## 2019-09-27 DIAGNOSIS — M25511 Pain in right shoulder: Secondary | ICD-10-CM

## 2019-09-27 DIAGNOSIS — M7918 Myalgia, other site: Secondary | ICD-10-CM

## 2019-09-27 DIAGNOSIS — M542 Cervicalgia: Secondary | ICD-10-CM

## 2019-09-27 DIAGNOSIS — G8929 Other chronic pain: Secondary | ICD-10-CM

## 2019-09-27 MED ORDER — LIDOCAINE HCL 1 % IJ SOLN
3.0000 mL | INTRAMUSCULAR | Status: AC | PRN
  Administered 2019-09-27: 13:00:00 3 mL

## 2019-09-27 MED ORDER — TRIAMCINOLONE ACETONIDE 40 MG/ML IJ SUSP
20.0000 mg | INTRAMUSCULAR | Status: AC | PRN
  Administered 2019-09-27: 13:00:00 20 mg via INTRAMUSCULAR
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
## 2019-09-28 ENCOUNTER — Ambulatory Visit: Payer: 59 | Admitting: Family Medicine

## 2019-09-28 DIAGNOSIS — M542 Cervicalgia: Secondary | ICD-10-CM

## 2019-09-28 DIAGNOSIS — G8929 Other chronic pain: Secondary | ICD-10-CM | POA: Diagnosis not present

## 2019-09-28 DIAGNOSIS — M25511 Pain in right shoulder: Secondary | ICD-10-CM

## 2019-09-28 DIAGNOSIS — M7918 Myalgia, other site: Secondary | ICD-10-CM | POA: Insufficient documentation

## 2019-09-28 MED ORDER — LIDOCAINE HCL 1 % IJ SOLN
3.0000 mL | INTRAMUSCULAR | Status: AC | PRN
  Administered 2019-09-28: 3 mL

## 2019-09-28 MED ORDER — TRIAMCINOLONE ACETONIDE 40 MG/ML IJ SUSP
20.0000 mg | INTRAMUSCULAR | Status: AC | PRN
  Administered 2019-09-28: 20 mg via INTRAMUSCULAR

## 2019-09-28 MED ORDER — KETOROLAC TROMETHAMINE 30 MG/ML IJ SOLN
15.0000 mg | INTRAMUSCULAR | Status: AC | PRN
  Administered 2019-09-28: 15 mg via INTRA_ARTICULAR
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
## 2019-10-04 ENCOUNTER — Ambulatory Visit (INDEPENDENT_AMBULATORY_CARE_PROVIDER_SITE_OTHER)
Admission: RE | Admit: 2019-10-04 | Discharge: 2019-10-04 | Disposition: A | Discharge status: Home / Self Care | Payer: 59 | Source: Ambulatory Visit

## 2019-10-04 ENCOUNTER — Other Ambulatory Visit: Payer: Self-pay

## 2019-10-04 DIAGNOSIS — J0100 Acute maxillary sinusitis, unspecified: Secondary | ICD-10-CM | POA: Diagnosis not present

## 2019-10-04 MED ORDER — MUCINEX 600 MG PO TB12: 1200.0000 mg | ORAL_TABLET | Freq: Two times a day (BID) | ORAL | 0 refills | Status: DC | PRN

## 2019-10-04 MED ORDER — AMOXICILLIN 875 MG PO TABS: 875.0000 mg | ORAL_TABLET | Freq: Two times a day (BID) | ORAL | 0 refills | Status: AC

## 2019-10-04 NOTE — Discharge Instructions (Signed)
Take the amoxicillin and Mucinex as directed.  Additionally, you can take ibuprofen.    Follow-up with your primary care provider or come here to be seen in person if your symptoms are not improving.

## 2019-10-04 NOTE — ED Provider Notes (Signed)
Virtual Visit via Video Note:  Erin Pratt  initiated request for Telemedicine visit with [facility] team. I connected with Erin Pratt  on 10/04/2019 at 10:09 AM  for a synchronized telemedicine visit using a video enabled HIPPA compliant telemedicine application. I verified that I am speaking with Erin Pratt  using two identifiers. Sharion Balloon, NP  was physically located in a [facility] Urgent care site and Kelisha Turnquist was located at a different location.   The limitations of evaluation and management by telemedicine as well as the availability of in-person appointments were discussed. Patient was informed that she  may incur a bill ( including co-pay) for this virtual visit encounter. Erin Pratt  expressed understanding and gave verbal consent to proceed with virtual visit.     History of Present Illness:Erin Pratt  is a 29 y.o. female presents for evaluation of cough productive of thick green mucous, sinus pressure, congestion, sore throat, ear pain L>R x 4 days.  She has been treating her symptoms at home with ibuprofen without relief.  She denies fever, difficulty swallowing, shortness of breath, vomiting, diarrhea, rash, or other symptoms.  She denies pregnancy or breastfeeding.     Allergies  Allergen Reactions  . Other     Seasonal allergies     Past Medical History:  Diagnosis Date  . Asthma   . Migraine      Social History   Tobacco Use  . Smoking status: Never Smoker  . Smokeless tobacco: Never Used  Substance Use Topics  . Alcohol use: No  . Drug use: No    ROS: as stated in HPI.  All other systems reviewed and negative.       Observations/Objective: Physical Exam  VITALS: Patient denies fever. GENERAL: Alert, appears well and in no acute distress. HEENT: Atraumatic. NECK: Normal movements of the head and neck. CARDIOPULMONARY: No increased WOB. Speaking in clear sentences. I:E ratio WNL.  MS: Moves all visible extremities without  noticeable abnormality. PSYCH: Pleasant and cooperative, well-groomed. Speech normal rate and rhythm. Affect is appropriate. Insight and judgement are appropriate. Attention is focused, linear, and appropriate.  NEURO: CN grossly intact. Oriented as arrived to appointment on time with no prompting. Moves both UE equally.  SKIN: No obvious lesions, wounds, erythema, or cyanosis noted on face or hands.   Assessment and Plan:    ICD-10-CM   1. Acute non-recurrent maxillary sinusitis  J01.00        Follow Up Instructions: Treating with amoxicillin, Mucinex, ibuprofen.  Instructed patient to follow-up with her PCP or come here to be seen in person if her symptoms are not improving.  Patient agrees to plan of care.    I discussed the assessment and treatment plan with the patient. The patient was provided an opportunity to ask questions and all were answered. The patient agreed with the plan and demonstrated an understanding of the instructions.   The patient was advised to call back or seek an in-person evaluation if the symptoms worsen or if the condition fails to improve as anticipated.      Sharion Balloon, NP  10/04/2019 10:09 AM         Sharion Balloon, NP 10/04/19 1009

## 2019-10-05 ENCOUNTER — Ambulatory Visit: Payer: 59 | Admitting: Family Medicine

## 2019-10-19 ENCOUNTER — Encounter (HOSPITAL_COMMUNITY): Payer: Self-pay | Admitting: *Deleted

## 2019-10-19 ENCOUNTER — Telehealth (HOSPITAL_COMMUNITY): Payer: Self-pay | Admitting: *Deleted

## 2019-10-19 NOTE — Telephone Encounter (Signed)
Left message and my chart letter sent with instructions for upcoming stress echo. Phone Number provided for patient to call with any questions.  Kirstie Peri

## 2019-10-21 ENCOUNTER — Other Ambulatory Visit: Payer: Self-pay

## 2019-10-21 ENCOUNTER — Encounter: Payer: Self-pay | Admitting: Family Medicine

## 2019-10-21 ENCOUNTER — Ambulatory Visit (INDEPENDENT_AMBULATORY_CARE_PROVIDER_SITE_OTHER): Payer: 59 | Admitting: Family Medicine

## 2019-10-21 ENCOUNTER — Ambulatory Visit: Payer: Self-pay

## 2019-10-21 DIAGNOSIS — M25511 Pain in right shoulder: Secondary | ICD-10-CM | POA: Diagnosis not present

## 2019-10-21 MED ORDER — NABUMETONE 750 MG PO TABS: 750.0000 mg | ORAL_TABLET | Freq: Two times a day (BID) | ORAL | 6 refills | Status: DC | PRN

## 2019-10-21 NOTE — Progress Notes (Signed)
Office Visit Note   Patient: Erin Pratt           Date of Birth: 01-Aug-1991           MRN: [id number] Visit Date: 10/21/2019 Requested by: Erin Pratt, [address] [place],  [place] [address] PCP: Erin Flaming, MD  Subjective: Chief Complaint  Patient presents with  . Right Shoulder - Pain    Right shoulder pain. Previous trigger point injections by Erin Pratt with no relief. 1 month ago felt a pop in right shoulder. Sharp anterior pain with weakness. Cannot push of floor with arms. Prior shoulder surgery, throwing injury.    HPI: She is here with right shoulder pain.  About a month ago she reached for something and pulled it toward her, she felt a pop in her shoulder.  She has had weakness with it since then when trying to push herself up off the floor.  She has a history of labrum and partial rotator cuff tears in the past playing softball, requiring surgical repair at [facility].  She was doing well until this injury.  Pain feels similar, it is mostly anterior.  She also struggles with myofascial trigger points and has 1 in her right shoulder that she would like injected today.  Cortisone injections have only given short-term relief in the past.  Denies any neck pain with this.  In addition she struggles with migraine headaches.  She has done better since getting ear piercing for this.  She still gets about 5 headaches per month.               ROS:   All other systems were reviewed and are negative.  Objective: Vital Signs: There were no vitals taken for this visit.  Physical Exam:  General:  Alert and oriented, in no acute distress. Pulm:  Breathing unlabored. Psy:  Normal mood, congruent affect. Skin: No rash or bruising. Right shoulder: Full active range of motion.  O'Brien's test is positive.  Speeds test is negative.  Isometric rotator cuff strength is 5/5 throughout.  No tenderness at the [facility] joint.  Mild tenderness over the long head biceps tendon.  The  biceps tendon does not feel like it subluxes.  She has a tender trigger point in the trapezius near the scapular spine.  Imaging: Diagnostic ultrasound right shoulder: Long head biceps tendon is located in its groove and has normal appearance.  Subscapularis tendon looks normal, no sign of tear.  Anterior glenohumeral joint is unremarkable, with dynamic imaging of the labrum does not appear to have a tear.  Supraspinatus looks normal with no tear as does the infraspinatus.  Posterior labrum likewise does not have a visible tear with dynamic imaging.  No significant subacromial/subdeltoid bursitis.   Assessment & Plan: 1.  Right shoulder pain, concerning for possible SLAP tear -At this point she is a single mother with a 62-year-old son, it would be very difficult to undergo surgery for her right now. -We will try rotator cuff exercises at home with Relafen as needed.  If symptoms persist she will contact me and I will order MRI arthrogram.  2.  Myofascial pain syndrome with symptomatic right shoulder trigger point -Elected to inject with dextrose prolotherapy today.  I gave her suggestions for vitamin supplementation as well as consideration of a food elimination diet for this and for migraine headaches.     Procedures: Right trapezius trigger point injection: After sterile prep with Betadine, injected 3 cc 1% lidocaine  without epinephrine and 2 cc 50% dextrose into the symptomatic trigger point with good immediate results.    PMFS History: Patient Active Problem List   Diagnosis Date Noted  . Myofascial pain syndrome 09/28/2019  . Dysplastic nevus of trunk 07/19/2012  . Headache, migraine, intractable 10/21/2011  . Urticaria 10/01/2011  . DUB (dysfunctional uterine bleeding) 07/01/2011   Past Medical History:  Diagnosis Date  . Asthma   . Migraine     Family History  Problem Relation Age of Onset  . Asthma Mother   . Hypertension Mother   . Arthritis Mother   . Asthma Father    . Asthma Sister   . Asthma Brother   . Arthritis Maternal Grandmother        RA  . Hypertension Maternal Grandmother   . Cancer - Lung Maternal Grandfather     Past Surgical History:  Procedure Laterality Date  . CESAREAN SECTION  2017  . ELBOW SURGERY Right 2008  . SHOULDER SURGERY Right 2009  . TONSILLECTOMY     Social History   Occupational History  . Occupation: asst Glass blower/designer  Tobacco Use  . Smoking status: Never Smoker  . Smokeless tobacco: Never Used  Substance and Sexual Activity  . Alcohol use: No  . Drug use: No  . Sexual activity: Not on file

## 2019-10-21 NOTE — Patient Instructions (Signed)
    Vitamin D3:  5,000 IU daily  Magnesium:  400 mg daily  Fish Oil:  1,000 mg of combined DHA/EPA once or twice daily  Vitamin B2:  100 mg daily

## 2019-10-22 ENCOUNTER — Other Ambulatory Visit (HOSPITAL_COMMUNITY)
Admission: RE | Admit: 2019-10-22 | Discharge: 2019-10-22 | Disposition: A | Discharge status: Home / Self Care | Payer: 59 | Source: Ambulatory Visit | Attending: Cardiovascular Disease | Admitting: Cardiovascular Disease

## 2019-10-22 DIAGNOSIS — Z20822 Contact with and (suspected) exposure to covid-19: Secondary | ICD-10-CM | POA: Insufficient documentation

## 2019-10-22 DIAGNOSIS — Z01812 Encounter for preprocedural laboratory examination: Secondary | ICD-10-CM | POA: Insufficient documentation

## 2019-10-22 LAB — SARS CORONAVIRUS 2 (TAT 6-24 HRS): SARS Coronavirus 2: NEGATIVE

## 2019-10-24 ENCOUNTER — Telehealth: Payer: Self-pay

## 2019-10-24 NOTE — Telephone Encounter (Signed)
Patient contacted and moved to Friday.

## 2019-10-24 NOTE — Telephone Encounter (Signed)
-----   Message from Geralynn Rile, MD sent at 10/23/2019  5:20 PM EDT ----- We need to reschedule her appointment. She has her stress on 3/17 and I don't need to see her until after her stress. She is currently scheduled 3/16.  [place] T. Audie Box, [place]  [address] [place], [place] [address] [phone]  5:21 PM

## 2019-10-25 ENCOUNTER — Telehealth: Payer: 59 | Admitting: Cardiovascular Disease

## 2019-10-26 ENCOUNTER — Ambulatory Visit (HOSPITAL_COMMUNITY): Payer: 59 | Attending: Cardiovascular Disease

## 2019-10-26 ENCOUNTER — Ambulatory Visit (HOSPITAL_COMMUNITY): Payer: 59

## 2019-10-26 ENCOUNTER — Other Ambulatory Visit: Payer: Self-pay

## 2019-10-26 DIAGNOSIS — R079 Chest pain, unspecified: Secondary | ICD-10-CM | POA: Diagnosis present
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
## 2019-10-28 ENCOUNTER — Telehealth: Payer: Self-pay | Admitting: Cardiovascular Disease

## 2019-10-28 ENCOUNTER — Telehealth (INDEPENDENT_AMBULATORY_CARE_PROVIDER_SITE_OTHER): Payer: 59 | Admitting: Cardiovascular Disease

## 2019-10-28 ENCOUNTER — Encounter: Payer: Self-pay | Admitting: Cardiovascular Disease

## 2019-10-28 VITALS — Ht 67.0 in | Wt 155.0 lb

## 2019-10-28 DIAGNOSIS — R079 Chest pain, unspecified: Secondary | ICD-10-CM

## 2019-10-28 DIAGNOSIS — Z712 Person consulting for explanation of examination or test findings: Secondary | ICD-10-CM

## 2019-10-28 DIAGNOSIS — Z7189 Other specified counseling: Secondary | ICD-10-CM

## 2019-10-28 NOTE — Telephone Encounter (Signed)
Called patient and prepared for VV.   Primary and MD aware.

## 2019-10-28 NOTE — Patient Instructions (Signed)
Medication Instructions:  The current medical regimen is effective;  continue present plan and medications.  *If you need a refill on your cardiac medications before your next appointment, please call your pharmacy*    Follow-Up: At [facility], you and your health needs are our priority.  As part of our continuing mission to provide you with exceptional heart care, we have created designated Provider Care Teams.  These Care Teams include your primary Cardiologist (physician) and Advanced Practice Providers (APPs -  Physician Assistants and Nurse Practitioners) who all work together to provide you with the care you need, when you need it.  We recommend signing up for the patient portal called "MyChart".  Sign up information is provided on this After Visit Summary.  MyChart is used to connect with patients for Virtual Visits (Telemedicine).  Patients are able to view lab/test results, encounter notes, upcoming appointments, etc.  Non-urgent messages can be sent to your provider as well.   To learn more about what you can do with MyChart, go to [email or web address].    Your next appointment:   As needed  The format for your next appointment:   In Person  Provider:   Woodbury O'Neal, MD      

## 2019-10-28 NOTE — Telephone Encounter (Signed)
Patient returning call for virtual appt today at 2:20pm. Secured Chatted Almyra Free and called Pod B but got no response. Just want to make sure patient gets a call back, thank you!

## 2019-11-05 ENCOUNTER — Other Ambulatory Visit: Payer: Self-pay | Admitting: Family Medicine

## 2019-12-07 ENCOUNTER — Ambulatory Visit: Payer: 59 | Attending: Internal Medicine

## 2019-12-07 DIAGNOSIS — Z20822 Contact with and (suspected) exposure to covid-19: Secondary | ICD-10-CM

## 2019-12-08 ENCOUNTER — Encounter: Payer: Self-pay | Admitting: Family Medicine

## 2019-12-08 LAB — SARS-COV-2, NAA 2 DAY TAT

## 2019-12-08 LAB — NOVEL CORONAVIRUS, NAA: SARS-CoV-2, NAA: NOT DETECTED

## 2019-12-08 NOTE — Telephone Encounter (Signed)
[place] for patient to increase dose? Please advise. Thanks

## 2019-12-12 ENCOUNTER — Other Ambulatory Visit: Payer: 59

## 2019-12-15 MED ORDER — SERTRALINE HCL 100 MG PO TABS: 200.0000 mg | ORAL_TABLET | Freq: Every day | ORAL | 1 refills | Status: DC

## 2020-01-16 ENCOUNTER — Encounter: Payer: Self-pay | Admitting: Family Medicine

## 2020-01-16 ENCOUNTER — Other Ambulatory Visit: Payer: Self-pay

## 2020-01-16 ENCOUNTER — Ambulatory Visit (INDEPENDENT_AMBULATORY_CARE_PROVIDER_SITE_OTHER): Payer: 59 | Admitting: Family Medicine

## 2020-01-16 VITALS — BP 98/70 | HR 87 | Temp 97.9°F | Ht 67.0 in | Wt 161.4 lb

## 2020-01-16 DIAGNOSIS — Z1159 Encounter for screening for other viral diseases: Secondary | ICD-10-CM

## 2020-01-16 DIAGNOSIS — F411 Generalized anxiety disorder: Secondary | ICD-10-CM | POA: Insufficient documentation

## 2020-01-16 DIAGNOSIS — K1370 Unspecified lesions of oral mucosa: Secondary | ICD-10-CM | POA: Diagnosis not present

## 2020-01-16 DIAGNOSIS — Z0000 Encounter for general adult medical examination without abnormal findings: Secondary | ICD-10-CM | POA: Diagnosis not present

## 2020-01-16 DIAGNOSIS — Z975 Presence of (intrauterine) contraceptive device: Secondary | ICD-10-CM | POA: Insufficient documentation

## 2020-01-16 NOTE — Progress Notes (Signed)
Patient: Erin Pratt MRN: [id number] DOB: 29-Nov-1990 PCP: [name], MD     Subjective:  Chief Complaint  Patient presents with  . Annual Exam  . Anxiety    HPI: The patient is a 29 y.o. female who presents today for annual exam. She denies any changes to past medical history. There have been no recent hospitalizations. They are following a well balanced diet and exercise plan. She does fast paced walk and plays outside with her son.  Weight has been stable. No complaints today.   GAD Currently on zoloft 200mg  and this dosage has done the trick at controlling her anxiety. She states she is doing much better. Her situation with her ex husband is only getting worse. She isn't doing counseling, but is in a support group.   Immunization History  Administered Date(s) Administered  . Influenza Inj Mdck Quad With Preservative 05/20/2019  . Influenza Split 06/16/2011  . Influenza,inj,Quad PF,6+ Mos 04/26/2013  . Influenza-Unspecified 05/20/2019  . Meningococcal Conjugate 06/16/2011  . Tdap 01/10/2016   Colonoscopy: routine screening  Mammogram: routine screening  Pap smear: with dr. Corinna Capra: 2020. Wnl.    Review of Systems  Constitutional: Negative for chills, fatigue and fever.  HENT: Negative for dental problem, ear pain, hearing loss and trouble swallowing.   Eyes: Negative for visual disturbance.  Respiratory: Negative for cough, chest tightness, shortness of breath and wheezing.   Cardiovascular: Negative for chest pain, palpitations and leg swelling.  Gastrointestinal: Negative for abdominal pain, blood in stool, diarrhea, nausea and vomiting.  Endocrine: Negative for cold intolerance, polydipsia, polyphagia and polyuria.  Genitourinary: Negative for dysuria, frequency, hematuria, pelvic pain, urgency and vaginal pain.  Musculoskeletal: Negative for arthralgias.  Skin: Negative for rash.  Neurological: Negative for dizziness, light-headedness and headaches.   Psychiatric/Behavioral: Negative for dysphoric mood and sleep disturbance. The patient is not nervous/anxious.     Allergies Patient is allergic to other.  Past Medical History Patient  has a past medical history of Asthma and Migraine.  Surgical History Patient  has a past surgical history that includes Tonsillectomy; Shoulder surgery (Right, 2009); Elbow surgery (Right, 2008); and Cesarean section (2017).  Family History Pateint's family history includes Arthritis in her maternal grandmother and mother; Asthma in her brother, father, mother, and sister; Cancer - Lung in her maternal grandfather; Hypertension in her maternal grandmother and mother.  Social History Patient  reports that she has never smoked. She has never used smokeless tobacco. She reports that she does not drink alcohol or use drugs.    Objective: Vitals:   01/16/20 1426  BP: 98/70  Pulse: 87  Temp: 97.9 F (36.6 C)  TempSrc: Temporal  SpO2: 98%  Weight: 161 lb 6.4 oz (73.2 kg)  Height: 5\' 7"  (1.702 m)    Body mass index is 25.28 kg/m.  Physical Exam Vitals reviewed.  Constitutional:      Appearance: Normal appearance. She is well-developed and normal weight.  HENT:     Head: Normocephalic and atraumatic.     Right Ear: Tympanic membrane, ear canal and external ear normal.     Left Ear: Tympanic membrane, ear canal and external ear normal.     Mouth/Throat:     Mouth: Mucous membranes are moist.     Comments: Lesion on left posterior hard palate. Raised and erythematous with almost vesicular look  Eyes:     Conjunctiva/sclera: Conjunctivae normal.     Pupils: Pupils are equal, round, and reactive to light.  Neck:  Thyroid: No thyromegaly.  Cardiovascular:     Rate and Rhythm: Normal rate and regular rhythm.     Heart sounds: Normal heart sounds. No murmur.  Pulmonary:     Effort: Pulmonary effort is normal.     Breath sounds: Normal breath sounds.  Abdominal:     General: Bowel sounds  are normal. There is no distension.     Palpations: Abdomen is soft.     Tenderness: There is no abdominal tenderness.  Musculoskeletal:     Cervical back: Normal range of motion and neck supple.  Lymphadenopathy:     Cervical: No cervical adenopathy.  Skin:    General: Skin is warm and dry.     Capillary Refill: Capillary refill takes less than 2 seconds.     Findings: No rash.  Neurological:     General: No focal deficit present.     Mental Status: She is alert and oriented to person, place, and time.     Cranial Nerves: No cranial nerve deficit.     Coordination: Coordination normal.     Deep Tendon Reflexes: Reflexes normal.  Psychiatric:        Mood and Affect: Mood normal.        Behavior: Behavior normal.        GAD 7 : Generalized Anxiety Score 01/16/2020 05/19/2019 03/30/2019  Nervous, Anxious, on [place] 0 3 3  Control/stop worrying 1 2 3   Worry too much - different things - 3 3  Trouble relaxing 0 3 2  Restless 0 0 1  Easily annoyed or irritable 1 2 1   Afraid - awful might happen 1 3 3   Total GAD 7 Score - 16 16  Anxiety Difficulty Somewhat difficult Not difficult at all Not difficult at all     Assessment/plan: 1. Annual physical exam Routine lab work today. She is not fasting. Needs for work. HM reviewed. UTD, need to get pap smear results. Continue health exercise/diet. F/u in one year or as needed.  Patient counseling [x]    Nutrition: Stressed importance of moderation in sodium/caffeine intake, saturated fat and cholesterol, caloric balance, sufficient intake of fresh fruits, vegetables, fiber, calcium, iron, and 1 mg of folate supplement per day (for females capable of pregnancy).  [x]    Stressed the importance of regular exercise.   []    Substance Abuse: Discussed cessation/primary prevention of tobacco, alcohol, or other drug use; driving or other dangerous activities under the influence; availability of treatment for abuse.   [x]    Injury prevention: Discussed  safety belts, safety helmets, smoke detector, smoking near bedding or upholstery.   [x]    Sexuality: Discussed sexually transmitted diseases, partner selection, use of condoms, avoidance of unintended pregnancy  and contraceptive alternatives.  [x]    Dental health: Discussed importance of regular tooth brushing, flossing, and dental visits.  [x]    Health maintenance and immunizations reviewed. Please refer to Health maintenance section.    - CBC with Differential/Platelet - Comprehensive metabolic panel - Lipid panel - TSH  2. GAD (generalized anxiety disorder) gad7 with significant improvement. Continue current dosage of zoloft. Continue support group. F/u with me in 6 months or sooner if needed. No refills needed.   3. Encounter for hepatitis C screening test for low risk patient  - Hepatitis C antibody  4. Mouth lesion  - Ambulatory referral to ENT  This visit occurred during the SARS-CoV-2 public health emergency.  Safety protocols were in place, including screening questions prior to the visit, additional usage of  staff PPE, and extensive cleaning of exam room while observing appropriate contact time as indicated for disinfecting solutions.     Return in about 6 months (around 07/17/2020) for anxiety .     [name], MD [place]  01/16/2020

## 2020-01-16 NOTE — Patient Instructions (Signed)
Preventive Care 21-29 Years Old, Female Preventive care refers to visits with your health care provider and lifestyle choices that can promote health and wellness. This includes:  A yearly physical exam. This may also be called an annual well check.  Regular dental visits and eye exams.  Immunizations.  Screening for certain conditions.  Healthy lifestyle choices, such as eating a healthy diet, getting regular exercise, not using drugs or products that contain nicotine and tobacco, and limiting alcohol use. What can I expect for my preventive care visit? Physical exam Your health care provider will check your:  Height and weight. This may be used to calculate body mass index (BMI), which tells if you are at a healthy weight.  Heart rate and blood pressure.  Skin for abnormal spots. Counseling Your health care provider may ask you questions about your:  Alcohol, tobacco, and drug use.  Emotional well-being.  Home and relationship well-being.  Sexual activity.  Eating habits.  Work and work environment.  Method of birth control.  Menstrual cycle.  Pregnancy history. What immunizations do I need?  Influenza (flu) vaccine  This is recommended every year. Tetanus, diphtheria, and pertussis (Tdap) vaccine  You may need a Td booster every 10 years. Varicella (chickenpox) vaccine  You may need this if you have not been vaccinated. Human papillomavirus (HPV) vaccine  If recommended by your health care provider, you may need three doses over 6 months. Measles, mumps, and rubella (MMR) vaccine  You may need at least one dose of MMR. You may also need a second dose. Meningococcal conjugate (MenACWY) vaccine  One dose is recommended if you are age 19-21 years and a first-year college student living in a residence hall, or if you have one of several medical conditions. You may also need additional booster doses. Pneumococcal conjugate (PCV13) vaccine  You may need  this if you have certain conditions and were not previously vaccinated. Pneumococcal polysaccharide (PPSV23) vaccine  You may need one or two doses if you smoke cigarettes or if you have certain conditions. Hepatitis A vaccine  You may need this if you have certain conditions or if you travel or work in places where you may be exposed to hepatitis A. Hepatitis B vaccine  You may need this if you have certain conditions or if you travel or work in places where you may be exposed to hepatitis B. Haemophilus influenzae type b (Hib) vaccine  You may need this if you have certain conditions. You may receive vaccines as individual doses or as more than one vaccine together in one shot (combination vaccines). Talk with your health care provider about the risks and benefits of combination vaccines. What tests do I need?  Blood tests  Lipid and cholesterol levels. These may be checked every 5 years starting at age 20.  Hepatitis C test.  Hepatitis B test. Screening  Diabetes screening. This is done by checking your blood sugar (glucose) after you have not eaten for a while (fasting).  Sexually transmitted disease (STD) testing.  BRCA-related cancer screening. This may be done if you have a family history of breast, ovarian, tubal, or peritoneal cancers.  Pelvic exam and Pap test. This may be done every 3 years starting at age 21. Starting at age 30, this may be done every 5 years if you have a Pap test in combination with an HPV test. Talk with your health care provider about your test results, treatment options, and if necessary, the need for more tests.   Follow these instructions at home: Eating and drinking   Eat a diet that includes fresh fruits and vegetables, whole grains, lean protein, and low-fat dairy.  Take vitamin and mineral supplements as recommended by your health care provider.  Do not drink alcohol if: ? Your health care provider tells you not to drink. ? You are  pregnant, may be pregnant, or are planning to become pregnant.  If you drink alcohol: ? Limit how much you have to 0-1 drink a day. ? Be aware of how much alcohol is in your drink. In the [place] one drink equals one 12 oz bottle of beer (355 mL), one 5 oz glass of wine (148 mL), or one 1 oz glass of hard liquor (44 mL). Lifestyle  Take daily care of your teeth and gums.  Stay active. Exercise for at least 30 minutes on 5 or more days each week.  Do not use any products that contain nicotine or tobacco, such as cigarettes, e-cigarettes, and chewing tobacco. If you need help quitting, ask your health care provider.  If you are sexually active, practice safe sex. Use a condom or other form of birth control (contraception) in order to prevent pregnancy and STIs (sexually transmitted infections). If you plan to become pregnant, see your health care provider for a preconception visit. What's next?  Visit your health care provider once a year for a well check visit.  Ask your health care provider how often you should have your eyes and teeth checked.  Stay up to date on all vaccines. This information is not intended to replace advice given to you by your health care provider. Make sure you discuss any questions you have with your health care provider. Document Revised: 04/08/2018 Document Reviewed: 04/08/2018 Elsevier Patient Education  2020 [employer].

## 2020-01-17 LAB — COMPREHENSIVE METABOLIC PANEL
ALT: 8 U/L (ref 0–35)
AST: 15 U/L (ref 0–37)
Albumin: 4.3 g/dL (ref 3.5–5.2)
Alkaline Phosphatase: 64 U/L (ref 39–117)
BUN: 11 mg/dL (ref 6–23)
CO2: 26 mEq/L (ref 19–32)
Calcium: 8.8 mg/dL (ref 8.4–10.5)
Chloride: 105 mEq/L (ref 96–112)
Creatinine, Ser: 0.73 mg/dL (ref 0.40–1.20)
GFR: 94.17 mL/min (ref >60.00)
Glucose, Bld: 84 mg/dL (ref 70–99)
Potassium: 4 mEq/L (ref 3.5–5.1)
Sodium: 138 mEq/L (ref 135–145)
Total Bilirubin: 0.3 mg/dL (ref 0.2–1.2)
Total Protein: 6.4 g/dL (ref 6.0–8.3)

## 2020-01-17 LAB — CBC WITH DIFFERENTIAL/PLATELET
Basophils Absolute: 0 10*3/uL (ref 0.0–0.1)
Basophils Relative: 0.7 % (ref 0.0–3.0)
Eosinophils Absolute: 0.2 10*3/uL (ref 0.0–0.7)
Eosinophils Relative: 3 % (ref 0.0–5.0)
HCT: 34 % — ABNORMAL LOW (ref 36.0–46.0)
Hemoglobin: 11.9 g/dL — ABNORMAL LOW (ref 12.0–15.0)
Lymphocytes Relative: 41.4 % (ref 12.0–46.0)
Lymphs Abs: 2.1 10*3/uL (ref 0.7–4.0)
MCHC: 35 g/dL (ref 30.0–36.0)
MCV: 89.9 fl (ref 78.0–100.0)
Monocytes Absolute: 0.5 10*3/uL (ref 0.1–1.0)
Monocytes Relative: 10.5 % (ref 3.0–12.0)
Neutro Abs: 2.3 10*3/uL (ref 1.4–7.7)
Neutrophils Relative %: 44.4 % (ref 43.0–77.0)
Platelets: 219 10*3/uL (ref 150.0–400.0)
RBC: 3.78 Mil/uL — ABNORMAL LOW (ref 3.87–5.11)
RDW: 12.8 % (ref 11.5–15.5)
WBC: 5.1 10*3/uL (ref 4.0–10.5)

## 2020-01-17 LAB — LIPID PANEL
Cholesterol: 111 mg/dL (ref 0–200)
HDL: 68.7 mg/dL (ref >39.00)
LDL Cholesterol: 33 mg/dL (ref 0–99)
NonHDL: 42.68
Total CHOL/HDL Ratio: 2
Triglycerides: 46 mg/dL (ref 0.0–149.0)
VLDL: 9.2 mg/dL (ref 0.0–40.0)

## 2020-01-17 LAB — HEPATITIS C ANTIBODY
Hepatitis C Ab: NONREACTIVE
SIGNAL TO CUT-OFF: 0 (ref <1.00)

## 2020-01-17 LAB — TSH: TSH: 2.85 u[IU]/mL (ref 0.35–4.50)

## 2020-01-18 ENCOUNTER — Encounter: Payer: Self-pay | Admitting: Family Medicine

## 2020-01-18 ENCOUNTER — Telehealth: Payer: Self-pay

## 2020-01-18 NOTE — Telephone Encounter (Signed)
[employer] form completed and sent to home address.

## 2020-02-03 ENCOUNTER — Encounter: Payer: Self-pay | Admitting: Family Medicine

## 2020-02-14 ENCOUNTER — Telehealth: Payer: 59 | Admitting: Family

## 2020-02-14 ENCOUNTER — Inpatient Hospital Stay
Admission: RE | Admit: 2020-02-14 | Discharge: 2020-02-14 | Disposition: A | Discharge status: Home / Self Care | Payer: 59 | Source: Ambulatory Visit

## 2020-02-14 ENCOUNTER — Other Ambulatory Visit: Payer: Self-pay

## 2020-02-14 DIAGNOSIS — J019 Acute sinusitis, unspecified: Secondary | ICD-10-CM | POA: Diagnosis not present

## 2020-02-14 DIAGNOSIS — B9689 Other specified bacterial agents as the cause of diseases classified elsewhere: Secondary | ICD-10-CM | POA: Diagnosis not present

## 2020-02-14 MED ORDER — AMOXICILLIN-POT CLAVULANATE 875-125 MG PO TABS: 1.0000 | ORAL_TABLET | Freq: Two times a day (BID) | ORAL | 0 refills | Status: DC

## 2020-02-14 NOTE — Progress Notes (Signed)

## 2020-02-17 ENCOUNTER — Ambulatory Visit (INDEPENDENT_AMBULATORY_CARE_PROVIDER_SITE_OTHER): Payer: 59 | Admitting: Otolaryngology

## 2020-04-24 ENCOUNTER — Other Ambulatory Visit: Payer: Self-pay | Admitting: Family Medicine

## 2020-05-22 ENCOUNTER — Ambulatory Visit (INDEPENDENT_AMBULATORY_CARE_PROVIDER_SITE_OTHER): Payer: 59 | Admitting: Otolaryngology

## 2020-05-22 ENCOUNTER — Encounter (INDEPENDENT_AMBULATORY_CARE_PROVIDER_SITE_OTHER): Payer: Self-pay | Admitting: Otolaryngology

## 2020-05-22 ENCOUNTER — Other Ambulatory Visit: Payer: Self-pay

## 2020-05-22 VITALS — Temp 97.3°F

## 2020-05-22 DIAGNOSIS — K1379 Other lesions of oral mucosa: Secondary | ICD-10-CM | POA: Diagnosis not present

## 2020-05-22 NOTE — Progress Notes (Signed)
HPI: Erin Pratt is a 29 y.o. female who presents is referred by [name] for evaluation of chronic recurring left palatal sore.  Patient is status post tonsillectomy when she was 29 years old.  She states that over the past year she has had recurrent sore on the left side of the soft palate that comes and goes.  It was worse several days ago but is doing better today.  It always reoccurs in the same area.  Patient is otherwise healthy.  She does not smoke.  Past Medical History:  Diagnosis Date  . Asthma   . Migraine    Past Surgical History:  Procedure Laterality Date  . CESAREAN SECTION  2017  . ELBOW SURGERY Right 2008  . SHOULDER SURGERY Right 2009  . TONSILLECTOMY     Social History   Socioeconomic History  . Marital status: Legally Separated    Spouse name: Not on file  . Number of children: 1  . Years of education: BA  . Highest education level: Not on file  Occupational History  . Occupation: asst Glass blower/designer  Tobacco Use  . Smoking status: Never Smoker  . Smokeless tobacco: Never Used  Vaping Use  . Vaping Use: Never used  Substance and Sexual Activity  . Alcohol use: No  . Drug use: No  . Sexual activity: Not on file  Other Topics Concern  . Not on file  Social History Narrative  . Not on file   Social Determinants of Health   Financial Resource Strain:   . Difficulty of Paying Living Expenses: Not on file  Food Insecurity:   . Worried About Charity fundraiser in the Last Year: Not on file  . Ran Out of Food in the Last Year: Not on file  Transportation Needs:   . Lack of Transportation (Medical): Not on file  . Lack of Transportation (Non-Medical): Not on file  Physical Activity:   . Days of Exercise per Week: Not on file  . Minutes of Exercise per Session: Not on file  Stress:   . Feeling of Stress : Not on file  Social Connections:   . Frequency of Communication with Friends and Family: Not on file  . Frequency of Social Gatherings with  Friends and Family: Not on file  . Attends Religious Services: Not on file  . Active Member of Clubs or Organizations: Not on file  . Attends Archivist Meetings: Not on file  . Marital Status: Not on file   Family History  Problem Relation Age of Onset  . Asthma Mother   . Hypertension Mother   . Arthritis Mother   . Asthma Father   . Asthma Sister   . Asthma Brother   . Arthritis Maternal Grandmother        RA  . Hypertension Maternal Grandmother   . Cancer - Lung Maternal Grandfather    Allergies  Allergen Reactions  . Other     Seasonal allergies   Prior to Admission medications   Medication Sig Start Date End Date Taking? Authorizing Provider  albuterol (PROVENTIL HFA;VENTOLIN HFA) 108 (90 Base) MCG/ACT inhaler INHALE 2 PUFFS INTO THE LUNGS EVERY 4 (FOUR) HOURS AS NEEDED FOR WHEEZING OR SHORTNESS OF BREATH. 11/08/18  Yes Billie Ruddy, MD  ibuprofen (ADVIL) 800 MG tablet Take 1 tablet by mouth as needed.   Yes [provider]  nabumetone (RELAFEN) 750 MG tablet Take 1 tablet (750 mg total) by mouth 2 (two) times  daily as needed. 10/21/19  Yes Hilts, Legrand Como, MD  sertraline (ZOLOFT) 100 MG tablet Take 2 tablets (200 mg total) by mouth daily. 12/15/19  Yes [name], MD  sertraline (ZOLOFT) 50 MG tablet TAKE 1 TABLET BY MOUTH EVERY DAY 04/24/20  Yes [name], MD  traZODone (DESYREL) 50 MG tablet TAKE 1/2 TABLET BY MOUTH AS NEEDED AT BEDTIME FOR SLEEP MAY INCREASE TO 1 TAB IF NEEDED 06/10/19  Yes [name], MD  amoxicillin-clavulanate (AUGMENTIN) 875-125 MG tablet Take 1 tablet by mouth 2 (two) times daily. Patient not taking: Reported on 05/22/2020 02/14/20   Dutch Quint B, FNP     Positive ROS: Otherwise negative  All other systems have been reviewed and were otherwise negative with the exception of those mentioned in the HPI and as above.  Physical Exam: Constitutional: Alert, well-appearing, no acute distress Ears: External ears  without lesions or tenderness. Ear canals are clear bilaterally with intact, clear TMs.  Nasal: External nose without lesions.. Clear nasal passages Oral: Lips and gums without lesions. Tongue is normal to examination.  Patient is status post tonsillectomy.  On examination she has a small nodule on the left side of the soft palate but this is not ulcerated and not sore to palpation with a Q-tip today.  She states that it will get larger and then rupture over several days.  But is not causing much problems today. Neck: No palpable adenopathy or masses Respiratory: Breathing comfortably  Skin: No facial/neck lesions or rash noted.  Procedures  Assessment: Left palate lesion questionable etiology.  Plan: Discussed with her concerning options.  At this point she has minimal findings and did not recommend any intervention.  However if it enlarges she will call us in follow-up for possible excisional biopsy under local anesthesia in the office. She will contact our office next time it enlarges and we will plan on excision under local anesthetic in the office.   Radene Journey, MD   CC:

## 2020-06-04 ENCOUNTER — Other Ambulatory Visit: Payer: Self-pay | Admitting: Family Medicine

## 2020-06-19 ENCOUNTER — Telehealth: Payer: 59 | Admitting: Nurse Practitioner

## 2020-06-19 DIAGNOSIS — J029 Acute pharyngitis, unspecified: Secondary | ICD-10-CM | POA: Diagnosis not present

## 2020-06-19 NOTE — Progress Notes (Signed)

## 2020-06-22 ENCOUNTER — Other Ambulatory Visit: Payer: Self-pay | Admitting: Family Medicine

## 2020-12-10 ENCOUNTER — Other Ambulatory Visit: Payer: Self-pay | Admitting: Family Medicine

## 2020-12-31 ENCOUNTER — Other Ambulatory Visit: Payer: Self-pay

## 2020-12-31 ENCOUNTER — Ambulatory Visit (INDEPENDENT_AMBULATORY_CARE_PROVIDER_SITE_OTHER): Payer: 59 | Admitting: Podiatry

## 2020-12-31 ENCOUNTER — Ambulatory Visit (INDEPENDENT_AMBULATORY_CARE_PROVIDER_SITE_OTHER): Payer: 59

## 2020-12-31 DIAGNOSIS — M67479 Ganglion, unspecified ankle and foot: Secondary | ICD-10-CM

## 2020-12-31 DIAGNOSIS — L989 Disorder of the skin and subcutaneous tissue, unspecified: Secondary | ICD-10-CM

## 2020-12-31 DIAGNOSIS — M67471 Ganglion, right ankle and foot: Secondary | ICD-10-CM

## 2021-01-01 IMAGING — DX DG CHEST 2V
2 series · 2 of 2 positions shown · non-contrast
Comparison: Chest x-ray dated 05/13/2018

CLINICAL DATA: Chronic progressive chest pain.

EXAM:
CHEST - 2 VIEW

[chest pa]
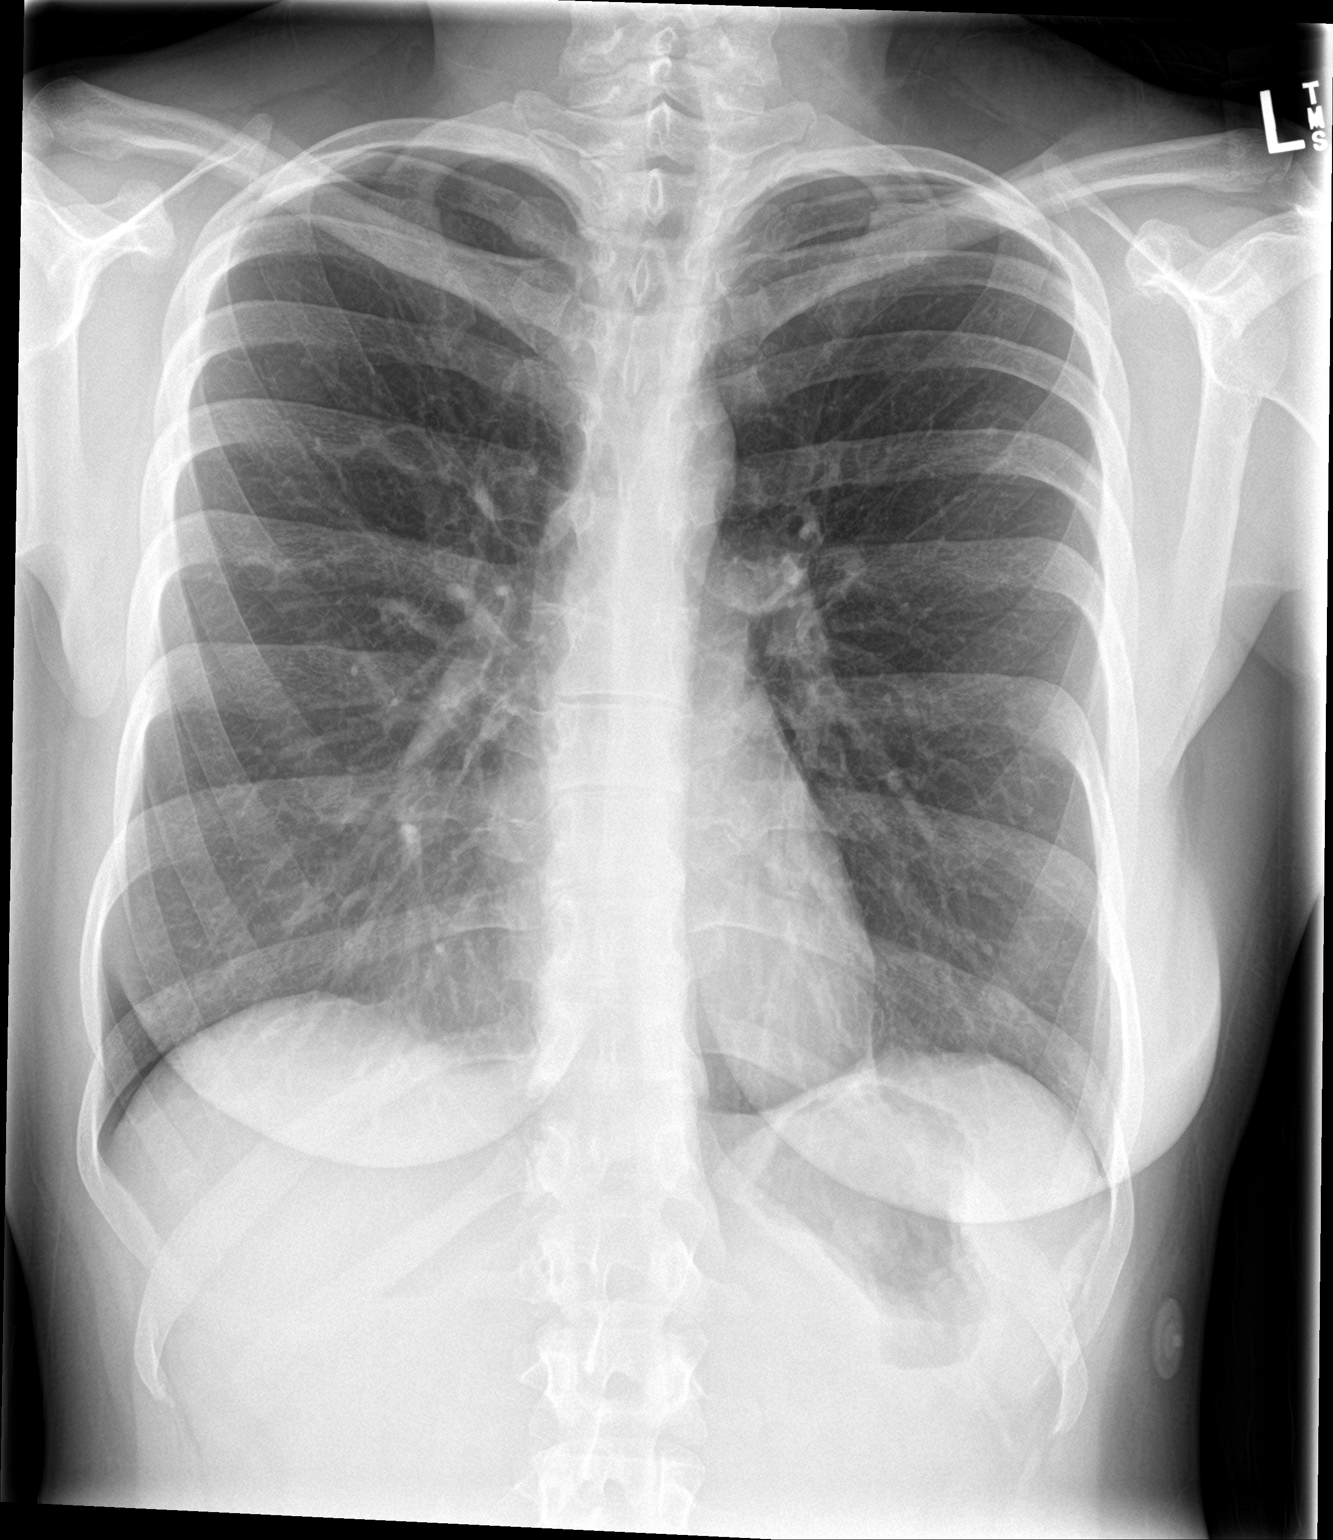

[chest lat]
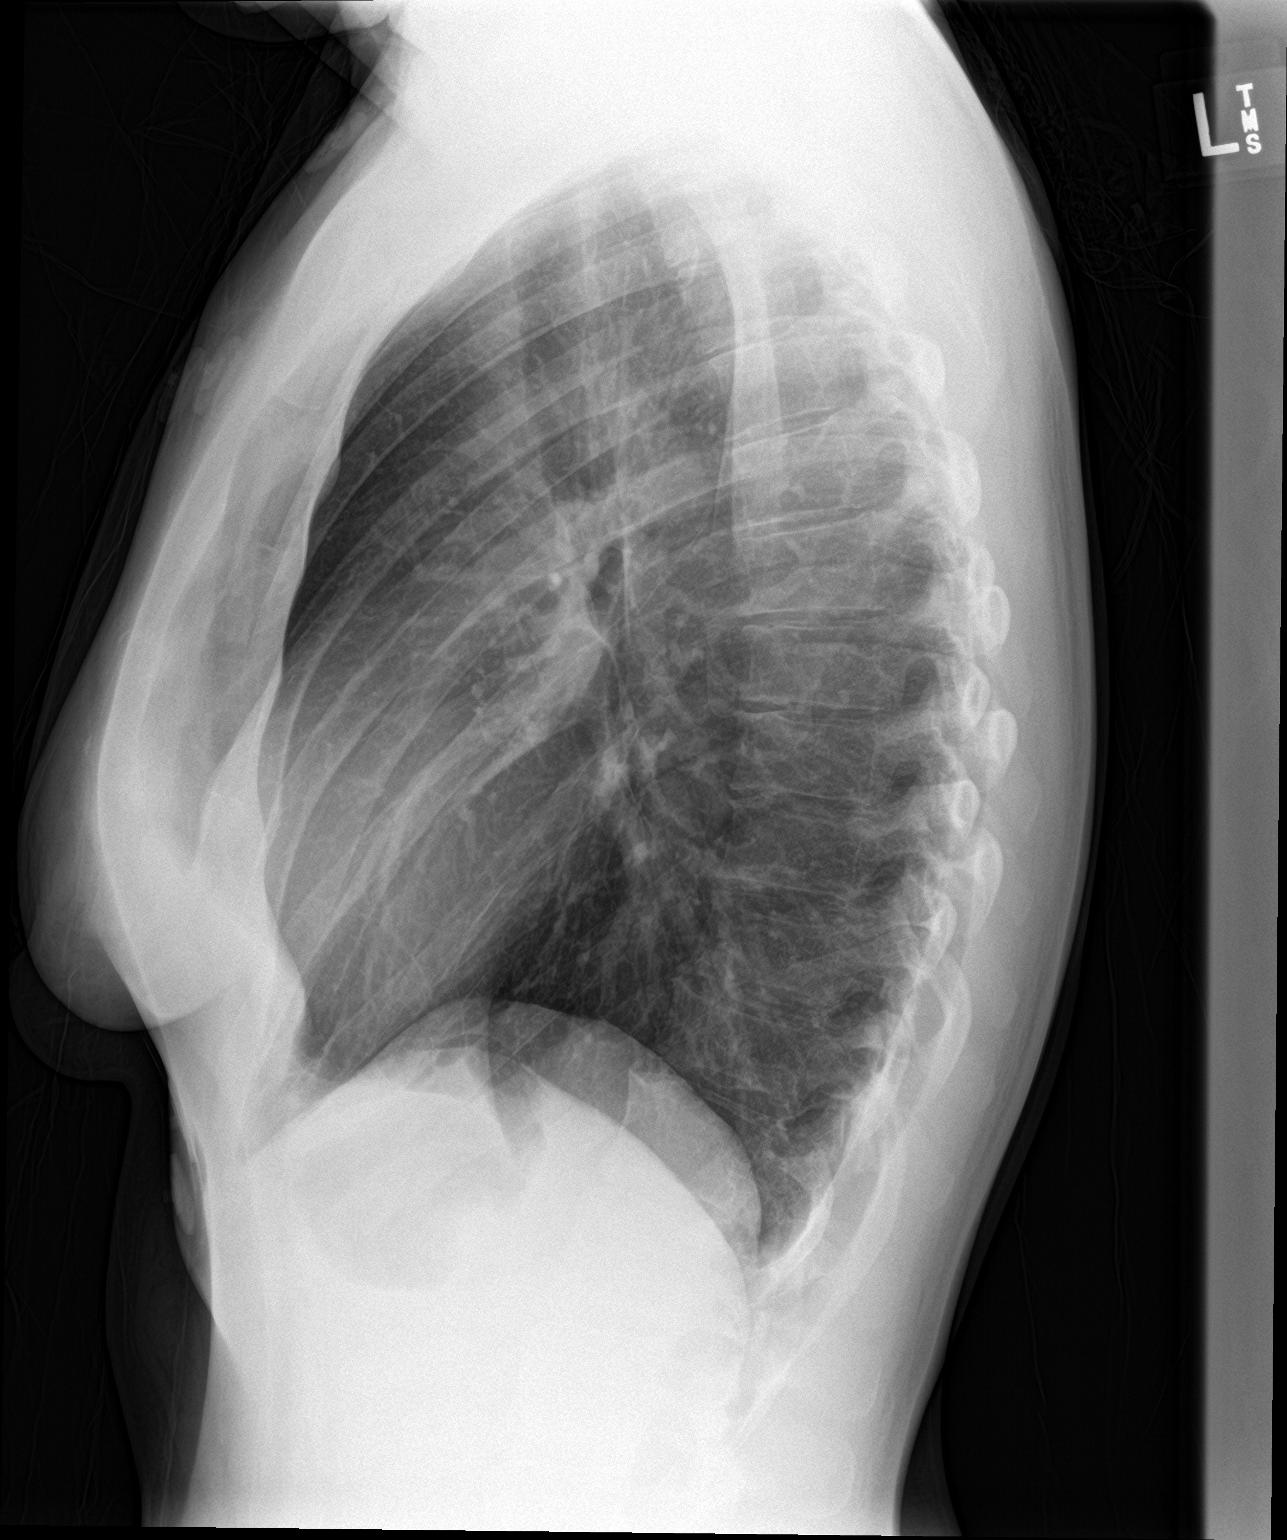

[2 of 2 positions shown; findings below may reference images not displayed]

FINDINGS: The heart size and mediastinal contours are within normal limits.
Both lungs are clear. Slight pectus excavatum deformity.
IMPRESSION: No acute disease.

## 2021-01-15 NOTE — Progress Notes (Signed)
   Subjective: 30 y.o. female presenting to the office today as a new patient for evaluation of a symptomatic callus lesion to the plantar aspect of the right foot.  Patient states that she has had the lesion for over a year now.  She tried to file it down herself but there is no improvement.  She presents for further treatment and evaluation.   Past Medical History:  Diagnosis Date  . Asthma   . Migraine      Objective:  Physical Exam General: Alert and oriented x3 in no acute distress  Dermatology: Hyperkeratotic lesion(s) present on the plantar aspect of the right forefoot subsecond MTPJ. Pain on palpation with a central nucleated core noted. Skin is warm, dry and supple bilateral lower extremities. Negative for open lesions or macerations.  Vascular: Palpable pedal pulses bilaterally. No edema or erythema noted. Capillary refill within normal limits.  Neurological: Epicritic and protective threshold grossly intact bilaterally.   Musculoskeletal Exam: Pain on palpation at the keratotic lesion(s) noted. Range of motion within normal limits bilateral. Muscle strength 5/5 in all groups bilateral.  Assessment: 1.  Symptomatic callus lesion subsecond MTPJ right foot   Plan of Care:  1. Patient evaluated 2. Excisional debridement of keratoic lesion(s) using a chisel blade was performed without incident.  3.  Recommend OTC corn and callus remover 4.  Small metatarsal pads were provided for the patient to apply to the insoles of the shoes to offload pressure from the second MTPJ  5.  Patient is to return to the clinic PRN.   *Works for [employer]  Edrick Kins, DPM [facility]  Dr. Edrick Kins, DPM    [address], [place] [address]                Office [phone]  Fax [phone]

## 2021-07-02 ENCOUNTER — Encounter: Payer: Self-pay | Admitting: Podiatry

## 2021-07-02 ENCOUNTER — Ambulatory Visit (INDEPENDENT_AMBULATORY_CARE_PROVIDER_SITE_OTHER): Payer: 59

## 2021-07-02 ENCOUNTER — Other Ambulatory Visit: Payer: Self-pay

## 2021-07-02 ENCOUNTER — Ambulatory Visit (INDEPENDENT_AMBULATORY_CARE_PROVIDER_SITE_OTHER): Payer: 59 | Admitting: Podiatry

## 2021-07-02 DIAGNOSIS — M25471 Effusion, right ankle: Secondary | ICD-10-CM

## 2021-07-02 DIAGNOSIS — M779 Enthesopathy, unspecified: Secondary | ICD-10-CM | POA: Diagnosis not present

## 2021-07-02 DIAGNOSIS — M25571 Pain in right ankle and joints of right foot: Secondary | ICD-10-CM | POA: Diagnosis not present

## 2021-07-02 DIAGNOSIS — M7751 Other enthesopathy of right foot: Secondary | ICD-10-CM | POA: Diagnosis not present

## 2021-07-02 MED ORDER — DICLOFENAC SODIUM 75 MG PO TBEC: 75.0000 mg | DELAYED_RELEASE_TABLET | Freq: Two times a day (BID) | ORAL | 1 refills | Status: DC

## 2021-07-02 MED ORDER — METHYLPREDNISOLONE 4 MG PO TBPK: ORAL_TABLET | ORAL | 0 refills | Status: DC

## 2021-07-03 ENCOUNTER — Encounter: Payer: Self-pay | Admitting: Podiatry

## 2021-07-03 ENCOUNTER — Ambulatory Visit: Payer: 59 | Admitting: Podiatry

## 2021-07-08 NOTE — Telephone Encounter (Signed)
Please advise it been 5 days

## 2021-07-10 NOTE — Progress Notes (Signed)
   Subjective:  30 y.o. female presenting today for evaluation of pain and tenderness associated to the right ankle.  Patient states that for the past 8 days she has had increasing pain and tenderness to the right ankle.  She says that it is very tender to touch and hard to walk on.  She denies a history of injury.  She has been resting her foot and taking ibuprofen.  She experiences sharp pain with swelling.  She presents for further treatment and evaluation.  Sudden onset.   Past Medical History:  Diagnosis Date   Asthma    Migraine      Objective / Physical Exam:  General:  The patient is alert and oriented x3 in no acute distress. Dermatology:  Skin is warm, dry and supple bilateral lower extremities. Negative for open lesions or macerations. Vascular:  Palpable pedal pulses bilaterally.  There are some moderate edema noted to the right ankle.  No erythema capillary refill within normal limits. Neurological:  Epicritic and protective threshold grossly intact bilaterally.  Musculoskeletal Exam:  Pain on palpation to the anterior lateral medial aspects of the patient's right ankle. Mild edema noted. Range of motion within normal limits to all pedal and ankle joints bilateral. Muscle strength 5/5 in all groups bilateral.   Radiographic Exam:  Normal osseous mineralization. Joint spaces preserved. No fracture/dislocation/boney destruction.    Assessment: 1.  Ankle pain with swelling and edema right 2.  Capsulitis right ankle  Plan of Care:  1. Patient was evaluated. X-Rays reviewed.  2. Injection of 0.5 mL Celestone Soluspan injected in the patient's right ankle. 3.  Cam boot dispensed.  Weightbearing as tolerated x3 weeks 4.  Prescription for Medrol Dosepak 5.  Prescription for diclofenac 75 mg 2 times daily to begin after completion of the Dosepak 6.  Ace wrap provided.  Wear daily 7.  Return to clinic in 3 weeks  *Chief Financial Officer for [employer]   Edrick Kins, DPM [facility]  Dr. Edrick Kins, [address] [place]                                        [place],  [address]                Office [phone]  Fax [phone]

## 2021-07-24 ENCOUNTER — Ambulatory Visit (INDEPENDENT_AMBULATORY_CARE_PROVIDER_SITE_OTHER): Payer: 59 | Admitting: Podiatry

## 2021-07-24 ENCOUNTER — Other Ambulatory Visit: Payer: Self-pay

## 2021-07-24 DIAGNOSIS — M722 Plantar fascial fibromatosis: Secondary | ICD-10-CM

## 2021-07-24 DIAGNOSIS — M7751 Other enthesopathy of right foot: Secondary | ICD-10-CM

## 2021-07-30 NOTE — Progress Notes (Signed)
° °  Subjective: 30 y.o. female presenting today for follow-up evaluation of right ankle pain and edema that she has been experiencing about 1 week prior to presentation.  Overall the patient states that there is some improvement.  She is still wearing the cam boot.  Patient still experiences some random shooting pains.  She is also been experiencing some pain along the plantar aspect of the heel since last visit.  She presents for further treatment and evaluation   Past Medical History:  Diagnosis Date   Asthma    Migraine      Objective: Physical Exam General: The patient is alert and oriented x3 in no acute distress.  Dermatology: Skin is warm, dry and supple bilateral lower extremities. Negative for open lesions or macerations bilateral.   Vascular: Dorsalis Pedis and Posterior Tibial pulses palpable bilateral.  Capillary fill time is immediate to all digits.  Neurological: Epicritic and protective threshold intact bilateral.   Musculoskeletal: Today the ankle is not as symptomatic to palpation and the edema has improved significantly.  There does continue to be some residual tenderness to palpation of the ankle there is some tenderness to palpation to the plantar aspect of the right heel along the plantar fascia. All other joints range of motion within normal limits bilateral. Strength 5/5 in all groups bilateral.   Assessment: 1. Plantar fasciitis right 2. Ankle capsulitis right; improved  Plan of Care:  1. Patient evaluated.   2. Injection of 0.5cc Celestone soluspan injected into the right plantar fascia  3. Patient may discontinue CAM boot 4. Ankle brace dispensed. Wear daily 5. Continue diclofenac PRN 6. RTC in 4 weeks, if there is not continued improvement we will go ahead and order MRI of the right ankle   Edrick Kins, DPM [facility]  Dr. Edrick Kins, DPM    [address], [place] [address]                 Office [phone]  Fax [phone]

## 2021-12-23 LAB — RESULTS CONSOLE HPV: CHL HPV: NEGATIVE

## 2021-12-23 LAB — HM PAP SMEAR

## 2022-01-27 ENCOUNTER — Ambulatory Visit (INDEPENDENT_AMBULATORY_CARE_PROVIDER_SITE_OTHER): Payer: 59 | Admitting: Podiatry

## 2022-01-27 DIAGNOSIS — M722 Plantar fascial fibromatosis: Secondary | ICD-10-CM

## 2022-01-27 MED ORDER — METHYLPREDNISOLONE 4 MG PO TBPK: ORAL_TABLET | ORAL | 0 refills | Status: DC

## 2022-01-27 MED ORDER — DICLOFENAC SODIUM 75 MG PO TBEC: 75.0000 mg | DELAYED_RELEASE_TABLET | Freq: Two times a day (BID) | ORAL | 1 refills | Status: DC

## 2022-01-27 MED ORDER — BETAMETHASONE SOD PHOS & ACET 6 (3-3) MG/ML IJ SUSP
3.0000 mg | Freq: Once | INTRAMUSCULAR | Status: AC
  Administered 2022-01-27: 3 mg via INTRA_ARTICULAR

## 2022-01-27 NOTE — Progress Notes (Signed)
   Subjective: 31 y.o. female presenting today for follow-up evaluation of plantar fasciitis to the right foot.  She was last seen in the office on 07/24/2021.  She says that the pain has slowly come back to the right heel.  Her ankle is feeling much better.  No new complaints at this time   Past Medical History:  Diagnosis Date   Asthma    Migraine    Past Surgical History:  Procedure Laterality Date   CESAREAN SECTION  2017   ELBOW SURGERY Right 2008   SHOULDER SURGERY Right 2009   TONSILLECTOMY     Allergies  Allergen Reactions   Other     Seasonal allergies     Objective: Physical Exam General: The patient is alert and oriented x3 in no acute distress.  Dermatology: Skin is warm, dry and supple bilateral lower extremities. Negative for open lesions or macerations bilateral.   Vascular: Dorsalis Pedis and Posterior Tibial pulses palpable bilateral.  Capillary fill time is immediate to all digits.  Neurological: Epicritic and protective threshold intact bilateral.   Musculoskeletal: Tenderness to palpation to the plantar aspect of the right heel along the plantar fascia. All other joints range of motion within normal limits bilateral. Strength 5/5 in all groups bilateral.   Assessment: 1. Plantar fasciitis right  Plan of Care:  1. Patient evaluated. Xrays reviewed.   2. Injection of 0.5cc Celestone soluspan injected into the right plantar fascia  3. Rx for Medrol Dose Pack placed 4. Rx for diclofenac 75 mg 2 times daily ordered for patient. 5.  Custom molded orthotics were recommended for the patient.  She is going to contact her insurance to check for coverage prior to making an appointment with the Pedorthist 6. Instructed patient regarding therapies and modalities at home to alleviate symptoms.  7. Return to clinic in 4 weeks.    *Has a 48-year-old son.  Works for [employer]   Edrick Kins, DPM [facility]  Dr. Edrick Kins, DPM    [address], [place] [address]                Office [phone]  Fax [phone]

## 2022-02-06 ENCOUNTER — Telehealth: Payer: 59 | Admitting: Physician Assistant

## 2022-02-06 DIAGNOSIS — L559 Sunburn, unspecified: Secondary | ICD-10-CM | POA: Diagnosis not present

## 2022-02-06 DIAGNOSIS — L568 Other specified acute skin changes due to ultraviolet radiation: Secondary | ICD-10-CM | POA: Diagnosis not present

## 2022-02-06 MED ORDER — TRIAMCINOLONE ACETONIDE 0.1 % EX CREA: 1.0000 | TOPICAL_CREAM | Freq: Two times a day (BID) | CUTANEOUS | 0 refills | Status: DC

## 2022-02-06 NOTE — Progress Notes (Signed)
I have spent 5 minutes in review of e-visit questionnaire, review and updating patient chart, medical decision making and response to patient.   Britt Theard Cody Shakisha Abend, PA-C    

## 2022-02-06 NOTE — Progress Notes (Signed)
E-Visit for Sunburn  We are sorry that you are not feeling well.  Here is how we plan to help!  Based on what you have shared with me, I'd like to share with you a treatment plan for sunburn.   Please read and follow instructions below. I am adding on a topical steroid cream to help with itch. You may also benefit from an OTC antihistamine like Claritin or Benadryl  Most sunburn is a first degree burn that turns the skin pink or red.  It can be painful to touch.  If you stayed in the sun for a prolonged period this might have progressed to a second degree burn with blistering!  Usually the pain and swelling starts after about 4 hours, peaks at 24 hours and begins to improve after 48 hours or about 2 days.  REMEMBER prolonged exposure to the sun increases your risk of skin cancer so use sunscreen before you go outside!  We will give you more information about sunscreen use later in your care plan.  Your sunburn can be managed by self-care at home.  Please use the following care guide to manage your sunburn.  If you symptoms worsen, you have other questions or concerns, or you develop any of the warnings signs listed in your care plan you will need to seek a face to face visit with a provider without waiting!  Home Care Advice for Treating Mild Sunburn:  Take Ibuprofen (Advil, Motrin) for pain relief as soon as possible.  The adult dosage is up to 600 mg every 6 hours.  Starting within 6 hours of sun exposure may greatly reduce your discomfort.  If you cannot take Ibuprofen you may use Acetaminophen instead. Do not take Ibuprofen if you have stomach problems, kidney disease or are pregnant. Do not take Ibuprofen if you have been told by your doctor or pharmacist to avoid this class of drugs. Do not take Acetaminophen if you have liver disease. Read the package warnings on any medication that you take!  2.  Use a steroid cream on the affected skin.  If you apply an over the counter steroid        cream as soon as possible and repeat it three times a day it may reduce the pain and      and swelling.  Until you get the steroid cream you may start with a moistening cream      cream or aloe gel.  3. For second or third degree sunburn with painful blistering, you can use an over the         counter product Burn Jel Plus Pain Relieving Gel. Apply in a thick even layer over the     affected area not more than 3 to 4 times daily If you need to cover the area to protect      it from friction of clothing, you can use Moist Burn Pads such as Hydrogel Burn Pads       which are available over the counter.  4.  Apply cool compresses to the burned areas several times a day.  5.  Avoid soap on the sunburned areas.  6.  Drink plenty of water.  It is easy to get dehydrated from prolong time in the sun      Outdoors.  7.  For any broken blisters: Trim off the dead skin with fine scissors.  It is wise to clean the scissor with alcohol before use. Apply antibiotic ointments to the  blister.  Apply twice a day for three days.  There are triple antibiotic ointments with topical pain relievers available at stores. Caution:leave intact blisters alone.  They are protecting the skin and will allow it to heal.  8.  Taking Vitamin C orally may reduce sun damage to your skin.  Follow the      Instructions on the bottle.  The recommended adult dosage is 2 grams.  What to Expect:  Pain usually stops after 2 or 3 days. Skin flaking and peeling usually occurs for 3-7 days after a sunburn.  Call your provider if:  You feel very weak or have difficulty standing. Blister develops on your face. You become sensitive to light because of eye pain. Your skin looks infected (red streaks, puss or worsening tenderness after 48 hours. You feel you should be seen.  Preventing Sunburns:  Apply 20-30 SPF sunscreen to your skin before going into the sun. Reapply every 2-4 hours or after sweating or swimming. Sunscreens  protect from sunburns but do not completely prevent skin damage.  Nancy Fetter exposure still increases your risk of premature aging and skin cancers.  Thank you for choosing an e-visit.  Your e-visit answers were reviewed by a board certified advanced clinical practitioner to complete your personal care plan. Depending upon the condition, your plan could have included both over the counter or prescription medications.  Please review your pharmacy choice. Make sure the pharmacy is open so you can pick up prescription now. If there is a problem, you may contact your provider through [employer] and have the prescription routed to another pharmacy.  Your safety is important to [place]. If you have drug allergies check your prescription carefully.   For the next 24 hours you can use MyChart to ask questions about today's visit, request a non-urgent call back, or ask for a work or school excuse. You will get an email in the next two days asking about your experience. I hope that your e-visit has been valuable and will speed your recovery.

## 2022-03-05 ENCOUNTER — Telehealth: Payer: 59 | Admitting: Physician Assistant

## 2022-03-05 DIAGNOSIS — J019 Acute sinusitis, unspecified: Secondary | ICD-10-CM

## 2022-03-05 DIAGNOSIS — B9789 Other viral agents as the cause of diseases classified elsewhere: Secondary | ICD-10-CM

## 2022-03-05 MED ORDER — BENZONATATE 100 MG PO CAPS: 100.0000 mg | ORAL_CAPSULE | Freq: Three times a day (TID) | ORAL | 0 refills | Status: DC | PRN

## 2022-03-05 MED ORDER — IPRATROPIUM BROMIDE 0.03 % NA SOLN: 2.0000 | Freq: Two times a day (BID) | NASAL | 0 refills | Status: DC

## 2022-03-05 NOTE — Progress Notes (Signed)
E-Visit for Sinus Problems  We are sorry that you are not feeling well.  Here is how we plan to help!  Based on what you have shared with me it looks like you have sinusitis.  Sinusitis is inflammation and infection in the sinus cavities of the head.  Based on your presentation I believe you most likely have Acute Viral Sinusitis.This is an infection most likely caused by a virus. There is not specific treatment for viral sinusitis other than to help you with the symptoms until the infection runs its course.  You may use an oral decongestant such as Mucinex D or if you have glaucoma or high blood pressure use plain Mucinex. Saline nasal spray help and can safely be used as often as needed for congestion, I have prescribed: Ipratropium Bromide nasal spray 0.03% 2 sprays in eah nostril 2-3 times a day  Some authorities believe that zinc sprays or the use of Echinacea may shorten the course of your symptoms.  Sinus infections are not as easily transmitted as other respiratory infection, however we still recommend that you avoid close contact with loved ones, especially the very young and elderly.  Remember to wash your hands thoroughly throughout the day as this is the number one way to prevent the spread of infection!  If desired, would recommend an at home covid test to rule this out as symptoms could be consistent with this.  Home Care: Only take medications as instructed by your medical team. Do not take these medications with alcohol. A steam or ultrasonic humidifier can help congestion.  You can place a towel over your head and breathe in the steam from hot water coming from a faucet. Avoid close contacts especially the very young and the elderly. Cover your mouth when you cough or sneeze. Always remember to wash your hands.  Get Help Right Away If: You develop worsening fever or sinus pain. You develop a severe head ache or visual changes. Your symptoms persist after you have completed  your treatment plan.  Make sure you Understand these instructions. Will watch your condition. Will get help right away if you are not doing well or get worse.   Thank you for choosing an e-visit.  Your e-visit answers were reviewed by a board certified advanced clinical practitioner to complete your personal care plan. Depending upon the condition, your plan could have included both over the counter or prescription medications.  Please review your pharmacy choice. Make sure the pharmacy is open so you can pick up prescription now. If there is a problem, you may contact your provider through [employer] and have the prescription routed to another pharmacy.  Your safety is important to [place]. If you have drug allergies check your prescription carefully.   For the next 24 hours you can use MyChart to ask questions about today's visit, request a non-urgent call back, or ask for a work or school excuse. You will get an email in the next two days asking about your experience. I hope that your e-visit has been valuable and will speed your recovery.  I provided 5 minutes of non face-to-face time during this encounter for chart review and documentation.

## 2022-03-11 ENCOUNTER — Ambulatory Visit: Payer: Self-pay | Admitting: Clinical

## 2022-03-29 ENCOUNTER — Other Ambulatory Visit: Payer: Self-pay | Admitting: Podiatry

## 2022-03-31 NOTE — Telephone Encounter (Signed)
Please advise 

## 2022-05-05 ENCOUNTER — Telehealth: Payer: Self-pay | Admitting: Physical Medicine and Rehabilitation

## 2022-05-05 NOTE — Telephone Encounter (Signed)
Error

## 2022-05-06 ENCOUNTER — Ambulatory Visit: Payer: 59 | Admitting: Physical Medicine and Rehabilitation

## 2022-05-07 ENCOUNTER — Encounter: Payer: Self-pay | Admitting: Physical Medicine and Rehabilitation

## 2022-05-07 ENCOUNTER — Ambulatory Visit (INDEPENDENT_AMBULATORY_CARE_PROVIDER_SITE_OTHER): Payer: 59 | Admitting: Physical Medicine and Rehabilitation

## 2022-05-07 VITALS — BP 102/68 | HR 75

## 2022-05-07 DIAGNOSIS — M7918 Myalgia, other site: Secondary | ICD-10-CM | POA: Diagnosis not present

## 2022-05-07 DIAGNOSIS — M549 Dorsalgia, unspecified: Secondary | ICD-10-CM

## 2022-05-07 NOTE — Progress Notes (Unsigned)
Erin Pratt - 31 y.o. female MRN [id number]  Date of birth: 11-18-1990  Office Visit Note: Visit Date: 05/07/2022 PCP: Rachelle Hora, MD Referred by: Rachelle Hora, MD  Subjective: Chief Complaint  Patient presents with   Right Shoulder - Pain   HPI: Erin Pratt is a 31 y.o. female who comes in today for evaluation of chronic, worsening and severe right upper back pain and possible repeat of myofascial trigger point injections. Pain ongoing for several years, worsened over the last few months. Pain is exacerbated by movement and activity. She describes her pain as sore and aching, currently rates as 7 out of 10. Some relief of pain with home exercise regimen, rest and use of over the counter medications. Patient has tried massage therapy in the past with some relief of pain. Patient underwent myofascial trigger point injections in our office in 2021 that provided significant and sustained relief of pain until recently. 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 Patient denies focal weakness, numbness and tingling. Patient denies recent trauma or falls.    Review of Systems  Musculoskeletal:  Positive for back pain and myalgias.  Neurological:  Negative for tingling, sensory change, focal weakness and weakness.  All other systems reviewed and are negative.  Otherwise per HPI.  Assessment & Plan: Visit Diagnoses:    ICD-10-CM   1. Myofascial pain syndrome  M79.18 Trigger Point Inj    2. Upper back pain on right side  M54.9 Trigger Point Inj       Plan: Findings:  Chronic, worsening and severe right sided upper back pain. No radicular symptoms. Patient continues to have severe pain despite good conservative therapies such as massage therapy, home exercise regimen, rest and use of medications. Patients clinical presentation and exam are consistent with myofascial pain syndrome. Next  step is to repeat myofascial trigger point injections which we did complete in office today without difficulty. If good relief of pain we can repeat this procedure infrequently as needed. If her pain persists we did discuss physical therapy with our in house team, I feel she could benefit from dry needling/manual treatments. I also provided her with business card for [facility] and encouraged her to contact him for continued massage therapy. No red flag symptoms noted upon exam.     Meds & Orders: No orders of the defined types were placed in this encounter.   Orders Placed This Encounter  Procedures   Trigger Point Inj    Follow-up: Return if symptoms worsen or fail to improve.   Procedures: Trigger Point Inj  Date/Time: 05/07/2022 10:15 AM  Performed by: Lorine Bears, NP Authorized by: Lorine Bears, NP   Consent Given by:  Patient Site marked: the procedure site was marked   Timeout: prior to procedure the correct patient, procedure, and site was verified   Indications:  Pain Total # of Trigger Points:  1 Location: back   Needle Size:  25 G Approach:  Dorsal Medications #1:  5 mL lidocaine 1 %; 40 mg triamcinolone acetonide 40 MG/ML; 30 mg ketorolac 30 MG/ML Patient tolerance:  Patient tolerated the procedure well with no immediate complications Comments: Myofascial trigger point injection to right levator scapulae, needling technique utilized.        Clinical History: No specialty comments available.   She reports that she has never smoked. She has never used smokeless tobacco.  No results for input(s): "HGBA1C", "LABURIC" in the last 8760 hours.  Objective:  VS:  HT:    WT:   BMI:     BP:102/68  HR:75bpm  TEMP: ( )  RESP:96 % Physical Exam Vitals and nursing note reviewed.  HENT:     Head: Normocephalic and atraumatic.     Right Ear: External ear normal.     Left Ear: External ear normal.     Nose: Nose normal.     Mouth/Throat:      Mouth: Mucous membranes are moist.  Eyes:     Extraocular Movements: Extraocular movements intact.  Cardiovascular:     Rate and Rhythm: Normal rate.     Pulses: Normal pulses.  Pulmonary:     Effort: Pulmonary effort is normal.  Abdominal:     General: Abdomen is flat. There is no distension.  Musculoskeletal:        General: Tenderness present.     Cervical back: Normal range of motion.     Comments: Pt rises from seated position to standing without difficulty. Palpable trigger point noted to right levator scapulae. Walks independently, gait steady.   Skin:    General: Skin is warm and dry.     Capillary Refill: Capillary refill takes less than 2 seconds.  Neurological:     General: No focal deficit present.     Mental Status: She is alert and oriented to person, place, and time.  Psychiatric:        Mood and Affect: Mood normal.        Behavior: Behavior normal.     Ortho Exam  Imaging: No results found.  Past Medical/Family/Surgical/Social History: Medications & Allergies reviewed per EMR, new medications updated. Patient Active Problem List   Diagnosis Date Noted   GAD (generalized anxiety disorder) 01/16/2020   IUD (intrauterine device) in place 01/16/2020   Myofascial pain syndrome 09/28/2019   Dysplastic nevus of trunk 07/19/2012   Headache, migraine, intractable 10/21/2011   Past Medical History:  Diagnosis Date   Asthma    Migraine    Family History  Problem Relation Age of Onset   Asthma Mother    Hypertension Mother    Arthritis Mother    Asthma Father    Asthma Sister    Asthma Brother    Arthritis Maternal Grandmother        RA   Hypertension Maternal Grandmother    Cancer - Lung Maternal Grandfather    Past Surgical History:  Procedure Laterality Date   CESAREAN SECTION  2017   ELBOW SURGERY Right 2008   SHOULDER SURGERY Right 2009   TONSILLECTOMY     Social History   Occupational History   Occupation: asst Glass blower/designer  Tobacco  Use   Smoking status: Never   Smokeless tobacco: Never  Vaping Use   Vaping Use: Never used  Substance and Sexual Activity   Alcohol use: No   Drug use: No   Sexual activity: Not on file

## 2022-05-07 NOTE — Progress Notes (Unsigned)
Numeric Pain Rating Scale and Functional Assessment Average Pain 8   In the last MONTH (on 0-10 scale) has pain interfered with the following?  1. General activity like being  able to carry out your everyday physical activities such as walking, climbing stairs, carrying groceries, or moving a chair?  Rating(9)   +Driver, -BT, -Dye Allergies.   Requesting repeat trigger point injections posterior shoulder.

## 2022-05-08 MED ORDER — TRIAMCINOLONE ACETONIDE 40 MG/ML IJ SUSP
40.0000 mg | INTRAMUSCULAR | Status: AC | PRN
  Administered 2022-05-07: 40 mg

## 2022-05-08 MED ORDER — LIDOCAINE HCL 1 % IJ SOLN
5.0000 mL | INTRAMUSCULAR | Status: AC | PRN
  Administered 2022-05-07: 5 mL

## 2022-05-08 MED ORDER — KETOROLAC TROMETHAMINE 30 MG/ML IJ SOLN
30.0000 mg | INTRAMUSCULAR | Status: AC | PRN
  Administered 2022-05-07: 30 mg via INTRAMUSCULAR

## 2022-06-03 ENCOUNTER — Ambulatory Visit (INDEPENDENT_AMBULATORY_CARE_PROVIDER_SITE_OTHER): Payer: 59 | Admitting: Podiatry

## 2022-06-03 DIAGNOSIS — M722 Plantar fascial fibromatosis: Secondary | ICD-10-CM

## 2022-06-03 MED ORDER — METHYLPREDNISOLONE 4 MG PO TBPK: ORAL_TABLET | ORAL | 0 refills | Status: DC

## 2022-06-03 MED ORDER — BETAMETHASONE SOD PHOS & ACET 6 (3-3) MG/ML IJ SUSP
3.0000 mg | Freq: Once | INTRAMUSCULAR | Status: AC
  Administered 2022-06-03: 3 mg via INTRA_ARTICULAR

## 2022-06-03 MED ORDER — MELOXICAM 15 MG PO TABS: 15.0000 mg | ORAL_TABLET | Freq: Every day | ORAL | 1 refills | Status: DC

## 2022-06-03 NOTE — Progress Notes (Signed)
   Chief Complaint  Patient presents with   Plantar Fasciitis    Patient is here for injections right foot for plantar fasciitis.    Subjective: 31 y.o. female presenting today for acute flareup of plantar fasciitis to the right foot.  Patient has a longstanding history of chronic plantar fasciitis intermittently.  She says injections helped significantly for several months.  She presents for further treatment and evaluation   Past Medical History:  Diagnosis Date   Asthma    Migraine    Past Surgical History:  Procedure Laterality Date   CESAREAN SECTION  2017   ELBOW SURGERY Right 2008   SHOULDER SURGERY Right 2009   TONSILLECTOMY     Allergies  Allergen Reactions   Other     Seasonal allergies     Objective: Physical Exam General: The patient is alert and oriented x3 in no acute distress.  Dermatology: Skin is warm, dry and supple bilateral lower extremities. Negative for open lesions or macerations bilateral.   Vascular: Dorsalis Pedis and Posterior Tibial pulses palpable bilateral.  Capillary fill time is immediate to all digits.  Neurological: Epicritic and protective threshold intact bilateral.   Musculoskeletal: Tenderness to palpation to the plantar aspect of the right heel along the plantar fascia. All other joints range of motion within normal limits bilateral. Strength 5/5 in all groups bilateral.   Assessment: 1. Plantar fasciitis right  Plan of Care:  1. Patient evaluated. Xrays reviewed.   2. Injection of 0.5cc Celestone soluspan injected into the right plantar fascia  3. Rx for Medrol Dose Pack placed 4. Rx for Meloxicam ordered for patient. 5.  Continue wearing good supportive shoes and sneakers 6. Instructed patient regarding therapies and modalities at home to alleviate symptoms.  7. Return to clinic PRN  *9 yr old son.  Works for [employer].  Going on a cruise for the first time out of [place] Bue, DPM [facility]  Dr. Edrick Kins, DPM    [address], [place] [address]                Office [phone]  Fax [phone]

## 2022-06-10 ENCOUNTER — Ambulatory Visit: Payer: 59 | Admitting: Podiatry

## 2022-07-20 ENCOUNTER — Telehealth: Payer: 59 | Admitting: Physician Assistant

## 2022-07-20 DIAGNOSIS — J069 Acute upper respiratory infection, unspecified: Secondary | ICD-10-CM | POA: Diagnosis not present

## 2022-07-20 MED ORDER — AZELASTINE HCL 0.1 % NA SOLN: 1.0000 | Freq: Two times a day (BID) | NASAL | 0 refills | Status: DC

## 2022-07-20 MED ORDER — BENZONATATE 100 MG PO CAPS: 100.0000 mg | ORAL_CAPSULE | Freq: Three times a day (TID) | ORAL | 0 refills | Status: DC | PRN

## 2022-07-20 NOTE — Progress Notes (Signed)
E-Visit for Upper Respiratory Infection   We are sorry you are not feeling well.  Here is how we plan to help!  Based on what you have shared with me, it looks like you may have a viral upper respiratory infection.  Upper respiratory infections are caused by a large number of viruses; however, rhinovirus is the most common cause.   Symptoms vary from person to person, with common symptoms including sore throat, cough, fatigue or lack of energy and feeling of general discomfort.  A low-grade fever of up to 100.4 may present, but is often uncommon.  Symptoms vary however, and are closely related to a person's age or underlying illnesses.  The most common symptoms associated with an upper respiratory infection are nasal discharge or congestion, cough, sneezing, headache and pressure in the ears and face.  These symptoms usually persist for about 3 to 10 days, but can last up to 2 weeks.  It is important to know that upper respiratory infections do not cause serious illness or complications in most cases.    Upper respiratory infections can be transmitted from person to person, with the most common method of transmission being a person's hands.  The virus is able to live on the skin and can infect other persons for up to 2 hours after direct contact.  Also, these can be transmitted when someone coughs or sneezes; thus, it is important to cover the mouth to reduce this risk.  To keep the spread of the illness at bay, good hand hygiene is very important.  This is an infection that is most likely caused by a virus. There are no specific treatments other than to help you with the symptoms until the infection runs its course.  We are sorry you are not feeling well.  Here is how we plan to help!   For nasal congestion, you may use an oral decongestants such as Mucinex D or if you have glaucoma or high blood pressure use plain Mucinex.  Saline nasal spray or nasal drops can help and can safely be used as often as  needed for congestion.  For your congestion, I have prescribed Azelastine nasal spray two sprays in each nostril twice a day  If you do not have a history of heart disease, hypertension, diabetes or thyroid disease, prostate/bladder issues or glaucoma, you may also use Sudafed to treat nasal congestion.  It is highly recommended that you consult with a pharmacist or your primary care physician to ensure this medication is safe for you to take.     If you have a cough, you may use cough suppressants such as Delsym and Robitussin.  If you have glaucoma or high blood pressure, you can also use Coricidin HBP.   For cough I have prescribed for you A prescription cough medication called Tessalon Perles 100 mg. You may take 1-2 capsules every 8 hours as needed for cough  If you have a sore or scratchy throat, use a saltwater gargle-  to  teaspoon of salt dissolved in a 4-ounce to 8-ounce glass of warm water.  Gargle the solution for approximately 15-30 seconds and then spit.  It is important not to swallow the solution.  You can also use throat lozenges/cough drops and Chloraseptic spray to help with throat pain or discomfort.  Warm or cold liquids can also be helpful in relieving throat pain.  For headache, pain or general discomfort, you can use Ibuprofen or Tylenol as directed.   Some authorities believe   that zinc sprays or the use of Echinacea may shorten the course of your symptoms.   HOME CARE Only take medications as instructed by your medical team. Be sure to drink plenty of fluids. Water is fine as well as fruit juices, sodas and electrolyte beverages. You may want to stay away from caffeine or alcohol. If you are nauseated, try taking small sips of liquids. How do you know if you are getting enough fluid? Your urine should be a pale yellow or almost colorless. Get rest. Taking a steamy shower or using a humidifier may help nasal congestion and ease sore throat pain. You can place a towel over  your head and breathe in the steam from hot water coming from a faucet. Using a saline nasal spray works much the same way. Cough drops, hard candies and sore throat lozenges may ease your cough. Avoid close contacts especially the very young and the elderly Cover your mouth if you cough or sneeze Always remember to wash your hands.   GET HELP RIGHT AWAY IF: You develop worsening fever. If your symptoms do not improve within 10 days You develop yellow or green discharge from your nose over 3 days. You have coughing fits You develop a severe head ache or visual changes. You develop shortness of breath, difficulty breathing or start having chest pain Your symptoms persist after you have completed your treatment plan  MAKE SURE YOU  Understand these instructions. Will watch your condition. Will get help right away if you are not doing well or get worse.  Thank you for choosing an e-visit.  Your e-visit answers were reviewed by a board certified advanced clinical practitioner to complete your personal care plan. Depending upon the condition, your plan could have included both over the counter or prescription medications.  Please review your pharmacy choice. Make sure the pharmacy is open so you can pick up prescription now. If there is a problem, you may contact your provider through MyChart messaging and have the prescription routed to another pharmacy.  Your safety is important to us. If you have drug allergies check your prescription carefully.   For the next 24 hours you can use MyChart to ask questions about today's visit, request a non-urgent call back, or ask for a work or school excuse. You will get an email in the next two days asking about your experience. I hope that your e-visit has been valuable and will speed your recovery.  I have spent 5 minutes in review of e-visit questionnaire, review and updating patient chart, medical decision making and response to patient.    Marquee Fuchs M Dewana Ammirati, PA-C  

## 2022-08-05 ENCOUNTER — Other Ambulatory Visit: Payer: Self-pay | Admitting: Podiatry

## 2023-03-06 ENCOUNTER — Encounter: Payer: Self-pay | Admitting: Nurse Practitioner

## 2023-03-06 ENCOUNTER — Ambulatory Visit (INDEPENDENT_AMBULATORY_CARE_PROVIDER_SITE_OTHER): Payer: 59 | Admitting: Nurse Practitioner

## 2023-03-06 VITALS — BP 106/68 | HR 80 | Temp 98.5°F | Ht 67.0 in | Wt 200.6 lb

## 2023-03-06 DIAGNOSIS — E669 Obesity, unspecified: Secondary | ICD-10-CM | POA: Insufficient documentation

## 2023-03-06 DIAGNOSIS — Z975 Presence of (intrauterine) contraceptive device: Secondary | ICD-10-CM

## 2023-03-06 DIAGNOSIS — Z6831 Body mass index (BMI) 31.0-31.9, adult: Secondary | ICD-10-CM

## 2023-03-06 DIAGNOSIS — F411 Generalized anxiety disorder: Secondary | ICD-10-CM | POA: Diagnosis not present

## 2023-03-06 DIAGNOSIS — J4520 Mild intermittent asthma, uncomplicated: Secondary | ICD-10-CM | POA: Insufficient documentation

## 2023-03-06 NOTE — Assessment & Plan Note (Signed)
Chronic, ongoing.  She states that she was taking sertraline about a year ago, however stopped after things calm down from her divorce.  She states that her anxiety has gotten slightly worse again, however does not feel like she needs medication at this time.  Her PHQ-9 is a 7 and her GAD-7 is an 8.  She denies SI/HI.  Discussed nonpharmacological treatments for anxiety such as journaling, meditation, yoga, exercise.  She can also talk with a therapist.  Follow-up in 3 months or sooner with concerns.

## 2023-03-06 NOTE — Patient Instructions (Signed)
It was great to see you!   Here is a list of weight loss medications you can see if your insurance covers:   Qsymia - pill Contrave - pill Semaglutide [facility]) - Injection Liraglutide (Saxenda) - Injection Trizepatide - zepbound  I also recommend continued focus on diet and nutrition along with exercise. You can use free apps like "My Fitness Pal" or "Lose It" on your smart phone to track your food.    Let's follow-up in 3 months, sooner if you have concerns.  If a referral was placed today, you will be contacted for an appointment. Please note that routine referrals can sometimes take up to 3-4 weeks to process. Please call our office if you haven't heard anything after this time frame.  Take care,  Rodman Pickle, NP

## 2023-03-06 NOTE — Assessment & Plan Note (Signed)
Chronic, stable.  Continue albuterol inhaler as needed for symptoms.

## 2023-03-06 NOTE — Assessment & Plan Note (Signed)
Mirena placed 12/2019.

## 2023-03-06 NOTE — Progress Notes (Signed)
New Patient Visit  BP 106/68 (BP Location: Left Arm)   Pulse 80   Temp 98.5 F (36.9 C) (Oral)   Ht 5\' 7"  (1.702 m)   Wt 200 lb 9.6 oz (91 kg)   SpO2 99%   BMI 31.42 kg/m    Subjective:    Patient ID: Erin Pratt, female    DOB: 02/06/1991, 32 y.o.   MRN: [id number]  CC: Chief Complaint  Patient presents with   Establish Care    NP. Est. Care, concerns with weight    HPI: Erin Pratt is a 32 y.o. female presents for new patient visit to establish care.  Introduced to Publishing rights manager role and practice setting.  All questions answered.  Discussed provider/patient relationship and expectations.  She states that she has a history of anxiety.  She was taking sertraline about a year ago when she was going through her divorce.  She states that her anxiety is getting slightly worse again.  She endorses some trouble falling asleep at night.  She had taken melatonin in the past which has helped.  She does have panic attacks at times, however she is able to calm herself down.  She denies SI/HI.    He has a history of asthma that is well-controlled.  She uses inhaler as needed for symptoms.  She states that she does not need to use this too often.  She denies shortness of breath and wheezing.  She has been having trouble with weight since her son was born 7 years ago. 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  She is not really exercising due to time constraints. She states that her son is a picky eater and tends to eat what he eats. This can include chicken nuggets, pasta, and some vegetables.      03/06/2023   10:01 AM 01/16/2020    2:28 PM 05/19/2019    7:58 AM 03/30/2019    2:35 PM  Depression screen PHQ 2/9  Decreased Interest 0 0 1 0  Down, Depressed, Hopeless 1 1 1  0  PHQ - 2 Score 1 1 2  0  Altered sleeping 2  3   Tired, decreased energy 2  1   Change in appetite 2  2   Feeling bad or failure about yourself  0  2   Trouble concentrating 0  1   Moving  slowly or fidgety/restless 0  0   Suicidal thoughts 0  0   PHQ-9 Score 7  11   Difficult doing work/chores Not difficult at all  Not difficult at all       03/06/2023   10:01 AM 01/16/2020    2:36 PM 05/19/2019    7:57 AM 03/30/2019    2:36 PM  GAD 7 : Generalized Anxiety Score  Nervous, Anxious, on Canal 2 0 3 3  Control/stop worrying 2 1 2 3   Worry too much - different things 1  3 3   Trouble relaxing 2 0 3 2  Restless 0 0 0 1  Easily annoyed or irritable 0 1 2 1   Afraid - awful might happen 1 1 3 3   Total GAD 7 Score 8  16 16   Anxiety Difficulty Not difficult at all Somewhat difficult Not difficult at all Not difficult at all    Past Medical History:  Diagnosis Date   Anxiety    situational   Asthma    Migraine     Past Surgical History:  Procedure Laterality  Date   CESAREAN SECTION  2017   ELBOW SURGERY Right 2008   SHOULDER SURGERY Right 2009   TONSILLECTOMY      Family History  Problem Relation Age of Onset   Diabetes Mother    Asthma Mother    Hypertension Mother    Arthritis Mother    Asthma Father    Asthma Sister    Asthma Brother    Arthritis Maternal Grandmother        RA   Hypertension Maternal Grandmother    Cancer - Lung Maternal Grandfather      Social History   Tobacco Use   Smoking status: Never   Smokeless tobacco: Never  Vaping Use   Vaping status: Never Used  Substance Use Topics   Alcohol use: No   Drug use: No    Current Outpatient Medications on File Prior to Visit  Medication Sig Dispense Refill   albuterol (PROVENTIL HFA;VENTOLIN HFA) 108 (90 Base) MCG/ACT inhaler INHALE 2 PUFFS INTO THE LUNGS EVERY 4 (FOUR) HOURS AS NEEDED FOR WHEEZING OR SHORTNESS OF BREATH. 6.7 Inhaler 0   azelastine (ASTELIN) 0.1 % nasal spray Place 1 spray into both nostrils 2 (two) times daily. Use in each nostril as directed 30 mL 0   levonorgestrel (MIRENA, 52 MG,) 20 MCG/DAY IUD 1 each by Intrauterine route once.     ibuprofen (ADVIL) 800 MG tablet  Take 1 tablet by mouth as needed. (Patient not taking: Reported on 03/06/2023)     No current facility-administered medications on file prior to visit.     Review of Systems  Constitutional:  Positive for fatigue. Negative for fever.  HENT: Negative.    Respiratory: Negative.    Cardiovascular: Negative.   Gastrointestinal: Negative.   Genitourinary: Negative.   Musculoskeletal: Negative.   Skin: Negative.   Neurological: Negative.   Psychiatric/Behavioral:  Positive for sleep disturbance. The patient is nervous/anxious.       Objective:    BP 106/68 (BP Location: Left Arm)   Pulse 80   Temp 98.5 F (36.9 C) (Oral)   Ht 5\' 7"  (1.702 m)   Wt 200 lb 9.6 oz (91 kg)   SpO2 99%   BMI 31.42 kg/m   Wt Readings from Last 3 Encounters:  03/06/23 200 lb 9.6 oz (91 kg)  01/16/20 161 lb 6.4 oz (73.2 kg)  10/28/19 155 lb (70.3 kg)    BP Readings from Last 3 Encounters:  03/06/23 106/68  05/07/22 102/68  01/16/20 98/70    Physical Exam Vitals and nursing note reviewed.  Constitutional:      General: She is not in acute distress.    Appearance: Normal appearance. She is obese.  HENT:     Head: Normocephalic and atraumatic.     Right Ear: Tympanic membrane, ear canal and external ear normal.     Left Ear: Tympanic membrane, ear canal and external ear normal.  Eyes:     Conjunctiva/sclera: Conjunctivae normal.  Cardiovascular:     Rate and Rhythm: Normal rate and regular rhythm.     Pulses: Normal pulses.     Heart sounds: Normal heart sounds.  Pulmonary:     Effort: Pulmonary effort is normal.     Breath sounds: Normal breath sounds.  Abdominal:     Palpations: Abdomen is soft.     Tenderness: There is no abdominal tenderness.  Musculoskeletal:        General: Normal range of motion.     Cervical back: Normal range  of motion and neck supple.     Right lower leg: No edema.     Left lower leg: No edema.  Lymphadenopathy:     Cervical: No cervical adenopathy.  Skin:     General: Skin is warm and dry.  Neurological:     General: No focal deficit present.     Mental Status: She is alert and oriented to person, place, and time.     Cranial Nerves: No cranial nerve deficit.     Coordination: Coordination normal.     Gait: Gait normal.  Psychiatric:        Mood and Affect: Mood normal.        Behavior: Behavior normal.        Thought Content: Thought content normal.        Judgment: Judgment normal.        Assessment & Plan:   Problem List Items Addressed This Visit       Respiratory   Mild intermittent asthma without complication    Chronic, stable.  Continue albuterol inhaler as needed for symptoms.        Other   GAD (generalized anxiety disorder) - Primary    Chronic, ongoing.  She states that she was taking sertraline about a year ago, however stopped after things calm down from her divorce.  She states that her anxiety has gotten slightly worse again, however does not feel like she needs medication at this time.  Her PHQ-9 is a 7 and her GAD-7 is an 8.  She denies SI/HI.  Discussed nonpharmacological treatments for anxiety such as journaling, meditation, yoga, exercise.  She can also talk with a therapist.  Follow-up in 3 months or sooner with concerns.      IUD (intrauterine device) in place    Mirena placed 12/2019.      Obesity (BMI 30-39.9)    BMI 31.4.  We had a discussion on nutrition and exercise.  Trying to increase the amount of lean meat and vegetables that she is eating.  Information printed as well.  She can try the over-the-counter supplement, but if she has side effects she should stop it.  We also did discuss medications that are available for weight loss.  Encouraged her to reach out to her insurance to see if they cover them.  Follow-up in 3 months.        Follow up plan: Return in about 3 months (around 06/06/2023) for CPE.  A total of 45 minutes were spent on this encounter today. When total time is documented, this  includes both the face-to-face and non-face-to-face time personally spent before, during and after the visit on the date of the encounter. Discussed nutrition, exercise, calming techniques for anxiety. Researched supplement for weight loss she was asking about.   Kynnedy Carreno A Olivine Hiers

## 2023-03-06 NOTE — Assessment & Plan Note (Signed)
BMI 31.4.  We had a discussion on nutrition and exercise.  Trying to increase the amount of lean meat and vegetables that she is eating.  Information printed as well.  She can try the over-the-counter supplement, but if she has side effects she should stop it.  We also did discuss medications that are available for weight loss.  Encouraged her to reach out to her insurance to see if they cover them.  Follow-up in 3 months.

## 2023-03-11 ENCOUNTER — Encounter: Payer: Self-pay | Admitting: Nurse Practitioner

## 2023-06-05 ENCOUNTER — Ambulatory Visit (INDEPENDENT_AMBULATORY_CARE_PROVIDER_SITE_OTHER): Payer: 59 | Admitting: Nurse Practitioner

## 2023-06-05 ENCOUNTER — Encounter: Payer: Self-pay | Admitting: Nurse Practitioner

## 2023-06-05 VITALS — BP 102/70 | HR 68 | Temp 97.1°F | Ht 67.0 in | Wt 199.0 lb

## 2023-06-05 DIAGNOSIS — Z136 Encounter for screening for cardiovascular disorders: Secondary | ICD-10-CM

## 2023-06-05 DIAGNOSIS — Z0000 Encounter for general adult medical examination without abnormal findings: Secondary | ICD-10-CM | POA: Insufficient documentation

## 2023-06-05 DIAGNOSIS — J4520 Mild intermittent asthma, uncomplicated: Secondary | ICD-10-CM

## 2023-06-05 DIAGNOSIS — F411 Generalized anxiety disorder: Secondary | ICD-10-CM

## 2023-06-05 DIAGNOSIS — Z975 Presence of (intrauterine) contraceptive device: Secondary | ICD-10-CM | POA: Diagnosis not present

## 2023-06-05 DIAGNOSIS — E669 Obesity, unspecified: Secondary | ICD-10-CM

## 2023-06-05 DIAGNOSIS — K5900 Constipation, unspecified: Secondary | ICD-10-CM

## 2023-06-05 LAB — LIPID PANEL
Cholesterol: 94 mg/dL (ref 0–200)
HDL: 50.9 mg/dL (ref >39.00)
LDL Cholesterol: 34 mg/dL (ref 0–99)
NonHDL: 42.66
Total CHOL/HDL Ratio: 2
Triglycerides: 45 mg/dL (ref 0.0–149.0)
VLDL: 9 mg/dL (ref 0.0–40.0)

## 2023-06-05 LAB — COMPREHENSIVE METABOLIC PANEL
ALT: 6 U/L (ref 0–35)
AST: 11 U/L (ref 0–37)
Albumin: 4.4 g/dL (ref 3.5–5.2)
Alkaline Phosphatase: 78 U/L (ref 39–117)
BUN: 11 mg/dL (ref 6–23)
CO2: 27 meq/L (ref 19–32)
Calcium: 9.2 mg/dL (ref 8.4–10.5)
Chloride: 105 meq/L (ref 96–112)
Creatinine, Ser: 0.79 mg/dL (ref 0.40–1.20)
GFR: 98.92 mL/min (ref >60.00)
Glucose, Bld: 85 mg/dL (ref 70–99)
Potassium: 4 meq/L (ref 3.5–5.1)
Sodium: 140 meq/L (ref 135–145)
Total Bilirubin: 0.7 mg/dL (ref 0.2–1.2)
Total Protein: 7.1 g/dL (ref 6.0–8.3)

## 2023-06-05 LAB — CBC WITH DIFFERENTIAL/PLATELET
Basophils Absolute: 0 10*3/uL (ref 0.0–0.1)
Basophils Relative: 0.8 % (ref 0.0–3.0)
Eosinophils Absolute: 0.2 10*3/uL (ref 0.0–0.7)
Eosinophils Relative: 3 % (ref 0.0–5.0)
HCT: 38 % (ref 36.0–46.0)
Hemoglobin: 12.7 g/dL (ref 12.0–15.0)
Lymphocytes Relative: 36.7 % (ref 12.0–46.0)
Lymphs Abs: 2 10*3/uL (ref 0.7–4.0)
MCHC: 33.4 g/dL (ref 30.0–36.0)
MCV: 91 fL (ref 78.0–100.0)
Monocytes Absolute: 0.4 10*3/uL (ref 0.1–1.0)
Monocytes Relative: 6.5 % (ref 3.0–12.0)
Neutro Abs: 2.9 10*3/uL (ref 1.4–7.7)
Neutrophils Relative %: 53 % (ref 43.0–77.0)
Platelets: 305 10*3/uL (ref 150.0–400.0)
RBC: 4.18 Mil/uL (ref 3.87–5.11)
RDW: 12.9 % (ref 11.5–15.5)
WBC: 5.5 10*3/uL (ref 4.0–10.5)

## 2023-06-05 LAB — TSH: TSH: 1.54 u[IU]/mL (ref 0.35–5.50)

## 2023-06-05 MED ORDER — ZEPBOUND 2.5 MG/0.5ML ~~LOC~~ SOAJ: 2.5000 mg | SUBCUTANEOUS | 0 refills | Status: DC

## 2023-06-05 NOTE — Assessment & Plan Note (Signed)
Chronic, stable. Continue drinking plenty of fluids and taking miralax as needed.

## 2023-06-05 NOTE — Assessment & Plan Note (Signed)
Chronic, stable. Follow-up with any concerns.

## 2023-06-05 NOTE — Assessment & Plan Note (Signed)
Mirena placed 12/2019.

## 2023-06-05 NOTE — Assessment & Plan Note (Signed)
Chronic, stable.  Continue albuterol inhaler as needed for symptoms.

## 2023-06-05 NOTE — Progress Notes (Signed)
BP 102/70 (BP Location: Left Arm)   Pulse 68   Temp (!) 97.1 F (36.2 C)   Ht 5\' 7"  (1.702 m)   Wt 199 lb (90.3 kg)   SpO2 97%   BMI 31.17 kg/m    Subjective:    Patient ID: Erin Pratt, female    DOB: 1991/05/15, 32 y.o.   MRN: [id number]  CC: Chief Complaint  Patient presents with   Annual Exam    With fasting lab work    HPI: Erin Pratt is a 32 y.o. female presenting on 06/05/2023 for comprehensive medical examination. Current medical complaints include: weight management  She has decreased her portion sizes and is getting physical activity at work. She called her insurance company and they do cover weight loss medications. She is interested in this to help with weight management.   She currently lives with: son Menopausal Symptoms: no  Depression and Anxiety Screen done today and results listed below:     06/05/2023    8:25 AM 03/06/2023   10:01 AM 01/16/2020    2:28 PM 05/19/2019    7:58 AM 03/30/2019    2:35 PM  Depression screen PHQ 2/9  Decreased Interest 1 0 0 1 0  Down, Depressed, Hopeless 1 1 1 1  0  PHQ - 2 Score 2 1 1 2  0  Altered sleeping 0 2  3   Tired, decreased energy 1 2  1    Change in appetite 1 2  2    Feeling bad or failure about yourself  0 0  2   Trouble concentrating 0 0  1   Moving slowly or fidgety/restless 0 0  0   Suicidal thoughts 0 0  0   PHQ-9 Score 4 7  11    Difficult doing work/chores Not difficult at all Not difficult at all  Not difficult at all       06/05/2023    8:26 AM 03/06/2023   10:01 AM 01/16/2020    2:36 PM 05/19/2019    7:57 AM  GAD 7 : Generalized Anxiety Score  Nervous, Anxious, on [place] 1 2 0 3  Control/stop worrying 1 2 1 2   Worry too much - different things 0 1  3  Trouble relaxing 1 2 0 3  Restless 0 0 0 0  Easily annoyed or irritable 1 0 1 2  Afraid - awful might happen 1 1 1 3   Total GAD 7 Score 5 8  16   Anxiety Difficulty Not difficult at all Not difficult at all Somewhat difficult Not difficult at all     The patient does not have a history of falls. I did not complete a risk assessment for falls. A plan of care for falls was not documented.   Past Medical History:  Past Medical History:  Diagnosis Date   Anxiety    situational   Asthma    Migraine     Surgical History:  Past Surgical History:  Procedure Laterality Date   CESAREAN SECTION  2017   ELBOW SURGERY Right 2008   SHOULDER SURGERY Right 2009   TONSILLECTOMY      Medications:  Current Outpatient Medications on File Prior to Visit  Medication Sig   albuterol (PROVENTIL HFA;VENTOLIN HFA) 108 (90 Base) MCG/ACT inhaler INHALE 2 PUFFS INTO THE LUNGS EVERY 4 (FOUR) HOURS AS NEEDED FOR WHEEZING OR SHORTNESS OF BREATH.   azelastine (ASTELIN) 0.1 % nasal spray Place 1 spray into both nostrils 2 (two) times daily. Use in  each nostril as directed   ibuprofen (ADVIL) 800 MG tablet Take 1 tablet by mouth as needed.   levonorgestrel (MIRENA, 52 MG,) 20 MCG/DAY IUD 1 each by Intrauterine route once.   No current facility-administered medications on file prior to visit.    Allergies:  Allergies  Allergen Reactions   Other     Seasonal allergies    Social History:  Social History   Socioeconomic History   Marital status: Divorced    Spouse name: Not on file   Number of children: 1   Years of education: BA   Highest education level: Not on file  Occupational History   Occupation: asst Print production planner  Tobacco Use   Smoking status: Never   Smokeless tobacco: Never  Vaping Use   Vaping status: Never Used  Substance and Sexual Activity   Alcohol use: No   Drug use: No   Sexual activity: Not Currently    Birth control/protection: I.U.D.  Other Topics Concern   Not on file  Social History Narrative   Not on file   Social Determinants of Health   Financial Resource Strain: Not on file  Food Insecurity: Not on file  Transportation Needs: Not on file  Physical Activity: Not on file  Stress: Not on file   Social Connections: Not on file  Intimate Partner Violence: Not on file   Social History   Tobacco Use  Smoking Status Never  Smokeless Tobacco Never   Social History   Substance and Sexual Activity  Alcohol Use No    Family History:  Family History  Problem Relation Age of Onset   Diabetes Mother    Asthma Mother    Hypertension Mother    Arthritis Mother    Asthma Father    Asthma Sister    Asthma Brother    Arthritis Maternal Grandmother        RA   Hypertension Maternal Grandmother    Cancer - Lung Maternal Grandfather     Past medical history, surgical history, medications, allergies, family history and social history reviewed with patient today and changes made to appropriate areas of the chart.   Review of Systems  Constitutional: Negative.   HENT: Negative.    Eyes: Negative.   Respiratory: Negative.    Cardiovascular: Negative.   Gastrointestinal:  Positive for constipation. Negative for abdominal pain and diarrhea.  Genitourinary: Negative.   Musculoskeletal: Negative.   Skin: Negative.   Neurological: Negative.   Psychiatric/Behavioral: Negative.     All other ROS negative except what is listed above and in the HPI.      Objective:    BP 102/70 (BP Location: Left Arm)   Pulse 68   Temp (!) 97.1 F (36.2 C)   Ht 5\' 7"  (1.702 m)   Wt 199 lb (90.3 kg)   SpO2 97%   BMI 31.17 kg/m   Wt Readings from Last 3 Encounters:  06/05/23 199 lb (90.3 kg)  03/06/23 200 lb 9.6 oz (91 kg)  01/16/20 161 lb 6.4 oz (73.2 kg)    Physical Exam Vitals and nursing note reviewed.  Constitutional:      General: She is not in acute distress.    Appearance: Normal appearance. She is obese.  HENT:     Head: Normocephalic and atraumatic.     Right Ear: Tympanic membrane, ear canal and external ear normal.     Left Ear: Tympanic membrane, ear canal and external ear normal.     Mouth/Throat:  Mouth: Mucous membranes are moist.     Pharynx: No posterior  oropharyngeal erythema.  Eyes:     Conjunctiva/sclera: Conjunctivae normal.  Cardiovascular:     Rate and Rhythm: Normal rate and regular rhythm.     Pulses: Normal pulses.     Heart sounds: Normal heart sounds.  Pulmonary:     Effort: Pulmonary effort is normal.     Breath sounds: Normal breath sounds.  Abdominal:     Palpations: Abdomen is soft.     Tenderness: There is no abdominal tenderness.  Musculoskeletal:        General: Normal range of motion.     Cervical back: Normal range of motion and neck supple.     Right lower leg: No edema.     Left lower leg: No edema.  Lymphadenopathy:     Cervical: No cervical adenopathy.  Skin:    General: Skin is warm and dry.  Neurological:     General: No focal deficit present.     Mental Status: She is alert and oriented to person, place, and time.     Cranial Nerves: No cranial nerve deficit.     Coordination: Coordination normal.     Gait: Gait normal.  Psychiatric:        Mood and Affect: Mood normal.        Behavior: Behavior normal.        Thought Content: Thought content normal.        Judgment: Judgment normal.     Results for orders placed or performed in visit on 03/11/23  HM PAP SMEAR  Result Value Ref Range   HM Pap smear WNL   Results Console HPV  Result Value Ref Range   CHL HPV Negative       Assessment & Plan:   Problem List Items Addressed This Visit       Respiratory   Mild intermittent asthma without complication    Chronic, stable.  Continue albuterol inhaler as needed for symptoms.        Other   GAD (generalized anxiety disorder)    Chronic, stable. Follow-up with any concerns.       IUD (intrauterine device) in place    Mirena placed 12/2019.      Obesity (BMI 30-39.9)    She has cut back on portion sizes and been actively trying to lose weight over the last 3 months. She has lost 1 pounds since her last visit. Congratulated her on these changes. She is interested in medication. Will have  her start zepbound 2.5mg  injection weekly. Discussed possible side effects. Continue with limiting portion sizes and exercise. Follow-up in 4-6 weeks.       Relevant Medications   tirzepatide (ZEPBOUND) 2.5 MG/0.5ML Pen   Constipation    Chronic, stable. Continue drinking plenty of fluids and taking miralax as needed.       Routine general medical examination at a health care facility - Primary    Health maintenance reviewed and updated. Discussed nutrition, exercise. Check CMP, CBC, TSH today. Follow-up 1 year.        Relevant Orders   CBC with Differential/Platelet   Comprehensive metabolic panel   TSH   Other Visit Diagnoses     Screening for cardiovascular condition       Relevant Orders   Lipid panel        Follow up plan: Return in about 4 weeks (around 07/03/2023) for 4-6 weeks weight management .   LABORATORY  TESTING:  - Pap smear: up to date  IMMUNIZATIONS:   - Tdap: Tetanus vaccination status reviewed: last tetanus booster within 10 years. - Influenza:  Declined - Pneumovax: Not applicable - Prevnar: Not applicable - HPV: Not applicable - Shingrix vaccine: Not applicable  SCREENING: -Mammogram: Not applicable  - Colonoscopy: Not applicable  - Bone Density: Not applicable   PATIENT COUNSELING:   Advised to take 1 mg of folate supplement per day if capable of pregnancy.   Sexuality: Discussed sexually transmitted diseases, partner selection, use of condoms, avoidance of unintended pregnancy  and contraceptive alternatives.   Advised to avoid cigarette smoking.  I discussed with the patient that most people either abstain from alcohol or drink within safe limits (<=14/week and <=4 drinks/occasion for males, <=7/weeks and <= 3 drinks/occasion for females) and that the risk for alcohol disorders and other health effects rises proportionally with the number of drinks per week and how often a drinker exceeds daily limits.  Discussed cessation/primary  prevention of drug use and availability of treatment for abuse.   Diet: Encouraged to adjust caloric intake to maintain  or achieve ideal body weight, to reduce intake of dietary saturated fat and total fat, to limit sodium intake by avoiding high sodium foods and not adding table salt, and to maintain adequate dietary potassium and calcium preferably from fresh fruits, vegetables, and low-fat dairy products.    stressed the importance of regular exercise  Injury prevention: Discussed safety belts, safety helmets, smoke detector, smoking near bedding or upholstery.   Dental health: Discussed importance of regular tooth brushing, flossing, and dental visits.    NEXT PREVENTATIVE PHYSICAL DUE IN 1 YEAR. Return in about 4 weeks (around 07/03/2023) for 4-6 weeks weight management .  Cressida Milford A Katurah Karapetian

## 2023-06-05 NOTE — Patient Instructions (Signed)
It was great to see you!  We are checking your labs today and will let you know the results via mychart/phone.   Let's start zepbound injection once a week. Make sure you are drinking plenty of water and you can take miralax as needed for constipation.  Let's follow-up in 4-6 weeks, sooner if you have concerns.  If a referral was placed today, you will be contacted for an appointment. Please note that routine referrals can sometimes take up to 3-4 weeks to process. Please call our office if you haven't heard anything after this time frame.  Take care,  Rodman Pickle, NP

## 2023-06-05 NOTE — Assessment & Plan Note (Signed)
Health maintenance reviewed and updated. Discussed nutrition, exercise. Check CMP, CBC, TSH today. Follow-up 1 year.   

## 2023-06-05 NOTE — Assessment & Plan Note (Signed)
She has cut back on portion sizes and been actively trying to lose weight over the last 3 months. She has lost 1 pounds since her last visit. Congratulated her on these changes. She is interested in medication. Will have her start zepbound 2.5mg  injection weekly. Discussed possible side effects. Continue with limiting portion sizes and exercise. Follow-up in 4-6 weeks.

## 2023-06-09 ENCOUNTER — Telehealth: Payer: Self-pay

## 2023-06-09 ENCOUNTER — Other Ambulatory Visit (HOSPITAL_COMMUNITY): Payer: Self-pay

## 2023-06-09 NOTE — Telephone Encounter (Signed)
Pharmacy Patient Advocate Encounter   Received notification from Physician's Office that prior authorization for Zepbound is required/requested.   Insurance verification completed.   The patient is insured through [facility] .   Per test claim: PA required; PA submitted to above mentioned insurance via CoverMyMeds Key/confirmation #/EOC Key: [id number] Status is pending

## 2023-06-10 ENCOUNTER — Other Ambulatory Visit (HOSPITAL_COMMUNITY): Payer: Self-pay

## 2023-06-10 NOTE — Telephone Encounter (Signed)
Pharmacy Patient Advocate Encounter  Received notification from [facility] that Prior Authorization for Zepbound 2.5MG /0.5ML pen-injectors has been APPROVED from 06/09/2023 to 02/04/2024. Ran test claim, Copay is $0.00. This test claim was processed through [facility]- copay amounts may vary at other pharmacies due to pharmacy/plan contracts, or as the patient moves through the different stages of their insurance plan.   PA #/Case ID/Reference #: [id number]

## 2023-06-10 NOTE — Telephone Encounter (Signed)
LVM that Rx of Wegovy approved

## 2023-07-01 ENCOUNTER — Other Ambulatory Visit: Payer: Self-pay | Admitting: Nurse Practitioner

## 2023-07-06 ENCOUNTER — Ambulatory Visit (INDEPENDENT_AMBULATORY_CARE_PROVIDER_SITE_OTHER): Payer: 59 | Admitting: Nurse Practitioner

## 2023-07-06 ENCOUNTER — Encounter: Payer: Self-pay | Admitting: Nurse Practitioner

## 2023-07-06 VITALS — BP 106/62 | HR 98 | Temp 97.0°F | Ht 67.0 in | Wt 185.0 lb

## 2023-07-06 DIAGNOSIS — E663 Overweight: Secondary | ICD-10-CM

## 2023-07-06 DIAGNOSIS — R110 Nausea: Secondary | ICD-10-CM | POA: Diagnosis not present

## 2023-07-06 MED ORDER — ZEPBOUND 5 MG/0.5ML ~~LOC~~ SOAJ: 5.0000 mg | SUBCUTANEOUS | 2 refills | Status: DC

## 2023-07-06 NOTE — Patient Instructions (Signed)
It was great to see you!  Increase your zepbound to 5mg  injection weekly.   Keep up the great work!   Let's follow-up in 3 months, sooner if you have concerns.  If a referral was placed today, you will be contacted for an appointment. Please note that routine referrals can sometimes take up to 3-4 weeks to process. Please call our office if you haven't heard anything after this time frame.  Take care,  Rodman Pickle, NP

## 2023-07-06 NOTE — Progress Notes (Signed)
Established Patient Office Visit  Subjective   Patient ID: Erin Pratt, female    DOB: 1990-08-28  Age: 32 y.o. MRN: [id number]  Chief Complaint  Patient presents with   Weight Management    Follow up, no concerns    HPI  Discussed the use of AI scribe software for clinical note transcription with the patient, who gave verbal consent to proceed.  History of Present Illness   The patient, on a weight loss regimen with Zepbound, reports a significant weight loss of approximately fourteen pounds. She reports a decreased appetite, often eating 2 meals a day, but ensures to eat to avoid feeling sick. The patient experiences nausea, particularly when she eats too much or skips meals. The patient maintains a balanced diet, incorporating proteins and portion control. Despite a busy schedule with work, school, and caring for her son, the patient remains active. The patient tolerates the medication well, with minimal side effects.       ROS See pertinent positives and negatives per HPI.    Objective:     BP 106/62 (BP Location: Left Arm)   Pulse 98   Temp (!) 97 F (36.1 C)   Ht 5\' 7"  (1.702 m)   Wt 185 lb (83.9 kg)   SpO2 98%   BMI 28.98 kg/m  BP Readings from Last 3 Encounters:  07/06/23 106/62  06/05/23 102/70  03/06/23 106/68   Wt Readings from Last 3 Encounters:  07/06/23 185 lb (83.9 kg)  06/05/23 199 lb (90.3 kg)  03/06/23 200 lb 9.6 oz (91 kg)      Physical Exam Vitals and nursing note reviewed.  Constitutional:      General: She is not in acute distress.    Appearance: Normal appearance.  HENT:     Head: Normocephalic.  Eyes:     Conjunctiva/sclera: Conjunctivae normal.  Cardiovascular:     Rate and Rhythm: Normal rate and regular rhythm.     Pulses: Normal pulses.     Heart sounds: Normal heart sounds.  Pulmonary:     Effort: Pulmonary effort is normal.     Breath sounds: Normal breath sounds.  Musculoskeletal:     Cervical back: Normal range of  motion.  Skin:    General: Skin is warm.  Neurological:     General: No focal deficit present.     Mental Status: She is alert and oriented to person, place, and time.  Psychiatric:        Mood and Affect: Mood normal.        Behavior: Behavior normal.        Thought Content: Thought content normal.        Judgment: Judgment normal.      Assessment & Plan:   Problem List Items Addressed This Visit       Other   Overweight    She has successfully lost 14 pounds on Zepbound 2.5mg , reporting only mild nausea which is tolerable. She is eating balanced meals and maintaining physical activity through work. We will increase Zepbound to 5mg  weekly, encourage continued balanced diet and physical activity, and check in 3 months or sooner if side effects occur.      Other Visit Diagnoses     Nausea    -  Primary   Drink plenty of fluids and make sure she is eating regularly avoiding any foods that trigger nausea.       Return in about 3 months (around 10/06/2023) for weight management - after  I return in March.    Gerre Scull, NP

## 2023-07-06 NOTE — Assessment & Plan Note (Signed)
She has successfully lost 14 pounds on Zepbound 2.5mg , reporting only mild nausea which is tolerable. She is eating balanced meals and maintaining physical activity through work. We will increase Zepbound to 5mg  weekly, encourage continued balanced diet and physical activity, and check in 3 months or sooner if side effects occur.

## 2023-07-23 ENCOUNTER — Encounter: Payer: Self-pay | Admitting: Nurse Practitioner

## 2023-07-23 MED ORDER — ZEPBOUND 2.5 MG/0.5ML ~~LOC~~ SOAJ: 2.5000 mg | SUBCUTANEOUS | 0 refills | Status: DC

## 2023-08-10 ENCOUNTER — Ambulatory Visit: Payer: 59 | Admitting: Podiatry

## 2023-09-12 ENCOUNTER — Telehealth: Payer: 59 | Admitting: Family Medicine

## 2023-09-12 DIAGNOSIS — B9689 Other specified bacterial agents as the cause of diseases classified elsewhere: Secondary | ICD-10-CM

## 2023-09-12 DIAGNOSIS — J019 Acute sinusitis, unspecified: Secondary | ICD-10-CM | POA: Diagnosis not present

## 2023-09-12 MED ORDER — AMOXICILLIN-POT CLAVULANATE 875-125 MG PO TABS
1.0000 | ORAL_TABLET | Freq: Two times a day (BID) | ORAL | 0 refills | Status: DC
Start: 2023-09-12 — End: 2023-10-16

## 2023-09-12 NOTE — Progress Notes (Signed)

## 2023-10-16 ENCOUNTER — Encounter: Payer: Self-pay | Admitting: Nurse Practitioner

## 2023-10-16 ENCOUNTER — Ambulatory Visit: Payer: 59 | Admitting: Nurse Practitioner

## 2023-10-16 VITALS — BP 108/68 | HR 95 | Temp 97.1°F | Ht 67.0 in | Wt 168.0 lb

## 2023-10-16 DIAGNOSIS — F411 Generalized anxiety disorder: Secondary | ICD-10-CM

## 2023-10-16 DIAGNOSIS — L710 Perioral dermatitis: Secondary | ICD-10-CM | POA: Diagnosis not present

## 2023-10-16 DIAGNOSIS — E663 Overweight: Secondary | ICD-10-CM

## 2023-10-16 MED ORDER — ZEPBOUND 2.5 MG/0.5ML ~~LOC~~ SOAJ: 2.5000 mg | SUBCUTANEOUS | 2 refills | Status: DC

## 2023-10-16 MED ORDER — HYDROXYZINE PAMOATE 25 MG PO CAPS: 25.0000 mg | ORAL_CAPSULE | Freq: Three times a day (TID) | ORAL | 0 refills | Status: AC | PRN

## 2023-10-16 NOTE — Patient Instructions (Signed)
 It was great to see you!  Start hydroxyzine 1 capsule every 8 hours as needed for anxiety, this may make you sleepy  Start hydrocortisone cream twice a day to the area on your cheek  Keep up the great work!  Let's follow-up in 3 months, sooner if you have concerns.  If a referral was placed today, you will be contacted for an appointment. Please note that routine referrals can sometimes take up to 3-4 weeks to process. Please call our office if you haven't heard anything after this time frame.  Take care,  Rodman Pickle, NP

## 2023-10-16 NOTE — Assessment & Plan Note (Signed)
 She has a red, itchy spot under her nose, likely perioral dermatitis. The lesion is slightly enlarged, dry, and mildly itchy. Apply hydrocortisone cream twice daily to the affected area for a few weeks. Reassess if there is no improvement.

## 2023-10-16 NOTE — Assessment & Plan Note (Addendum)
 Chronic, not controlled. Anxiety attacks are triggered by communication with her ex-partner, occurring monthly and causing distress. She prefers not to use daily medication. Prescribe hydroxyzine 25mg  to be taken every 8 hours as needed for anxiety, with caution regarding drowsiness. Advise against taking hydroxyzine while at work or when driving. Follow-up in 3 months or sooner with concerns.

## 2023-10-16 NOTE — Assessment & Plan Note (Signed)
 She lost weight from 199 to 168 pounds and resumed zepbound at 2.5 mg, which is better tolerated. Her goal weight is 150-160 pounds, though weight loss may slow as she nears this target. Continue the current dose of weight management medication. Encourage healthy eating habits and regular physical activity. Discuss a maintenance plan once the weight goal is achieved. Monitor for any adverse effects of the medication.

## 2023-10-16 NOTE — Progress Notes (Signed)
 Established Patient Office Visit  Subjective   Patient ID: Erin Pratt, female    DOB: 08/27/1990  Age: 33 y.o. MRN: [id number]  Chief Complaint  Patient presents with   Weight Management    Follow up, no concerns    HPI  Discussed the use of AI scribe software for clinical note transcription with the patient, who gave verbal consent to proceed.  History of Present Illness   The patient, with a history of weight issues, has been successfully losing weight and is down 17 pounds. She reports that her appetite, nausea, and vomiting have improved since reducing the dosage of her zepbound from 5mg  to 2.5mg . The higher dosage was causing her to feel nauseous and lose her appetite. She reports still feeling hungry on the lower dosage but is able to eat without feeling sick. She took a break from the medication during the holidays but has since resumed taking it regularly. She is aiming to reach a weight of 150-160 pounds and a size 10 in pants.  In addition to her weight loss progress, the patient has noticed a red spot under her nose that has been present for a couple of weeks. It feels dry and itchy, and has gotten slightly bigger over time. She has not been applying anything to it other than makeup and lotion.  The patient also reports experiencing anxiety attacks when she receives texts from her ex-partner. These attacks can last a day or two and can cause her to lose her appetite and sometimes vomit. These incidents occur approximately once a month. She notes that while she was going through her divorce, she was taking sertraline, however she feels like she does not need that every day.       ROS See pertinent positives and negatives per HPI.    Objective:     BP 108/68 (BP Location: Left Arm, Patient Position: Sitting, Cuff Size: Normal)   Pulse 95   Temp (!) 97.1 F (36.2 C)   Ht 5\' 7"  (1.702 m)   Wt 168 lb (76.2 kg)   LMP 09/30/2023   SpO2 100%   BMI 26.31 kg/m  BP  Readings from Last 3 Encounters:  10/16/23 108/68  07/06/23 106/62  06/05/23 102/70   Wt Readings from Last 3 Encounters:  10/16/23 168 lb (76.2 kg)  07/06/23 185 lb (83.9 kg)  06/05/23 199 lb (90.3 kg)      Physical Exam Vitals and nursing note reviewed.  Constitutional:      General: She is not in acute distress.    Appearance: Normal appearance.  HENT:     Head: Normocephalic.  Eyes:     Conjunctiva/sclera: Conjunctivae normal.  Cardiovascular:     Rate and Rhythm: Normal rate and regular rhythm.     Pulses: Normal pulses.     Heart sounds: Normal heart sounds.  Pulmonary:     Effort: Pulmonary effort is normal.     Breath sounds: Normal breath sounds.  Musculoskeletal:     Cervical back: Normal range of motion.  Skin:    General: Skin is warm.     Findings: Rash (small, round scaly rash below nose) present.  Neurological:     General: No focal deficit present.     Mental Status: She is alert and oriented to person, place, and time.  Psychiatric:        Mood and Affect: Mood normal.        Behavior: Behavior normal.  Thought Content: Thought content normal.        Judgment: Judgment normal.      Assessment & Plan:   Problem List Items Addressed This Visit       Digestive   Perioral dermatitis - Primary   She has a red, itchy spot under her nose, likely perioral dermatitis. The lesion is slightly enlarged, dry, and mildly itchy. Apply hydrocortisone cream twice daily to the affected area for a few weeks. Reassess if there is no improvement.        Other   GAD (generalized anxiety disorder)   Chronic, not controlled. Anxiety attacks are triggered by communication with her ex-partner, occurring monthly and causing distress. She prefers not to use daily medication. Prescribe hydroxyzine 25mg  to be taken every 8 hours as needed for anxiety, with caution regarding drowsiness. Advise against taking hydroxyzine while at work or when driving. Follow-up in 3  months or sooner with concerns.       Relevant Medications   hydrOXYzine (VISTARIL) 25 MG capsule   Overweight   She lost weight from 199 to 168 pounds and resumed zepbound at 2.5 mg, which is better tolerated. Her goal weight is 150-160 pounds, though weight loss may slow as she nears this target. Continue the current dose of weight management medication. Encourage healthy eating habits and regular physical activity. Discuss a maintenance plan once the weight goal is achieved. Monitor for any adverse effects of the medication.      Return in about 3 months (around 01/16/2024) for weight, anxiety.    Gerre Scull, NP

## 2023-11-09 ENCOUNTER — Ambulatory Visit (INDEPENDENT_AMBULATORY_CARE_PROVIDER_SITE_OTHER): Admitting: Podiatry

## 2023-11-09 ENCOUNTER — Encounter: Payer: Self-pay | Admitting: Podiatry

## 2023-11-09 DIAGNOSIS — M62461 Contracture of muscle, right lower leg: Secondary | ICD-10-CM | POA: Diagnosis not present

## 2023-11-09 DIAGNOSIS — M722 Plantar fascial fibromatosis: Secondary | ICD-10-CM

## 2023-11-09 MED ORDER — BETAMETHASONE SOD PHOS & ACET 6 (3-3) MG/ML IJ SUSP
3.0000 mg | Freq: Once | INTRAMUSCULAR | Status: AC
  Administered 2023-11-09: 3 mg via INTRA_ARTICULAR

## 2023-11-09 MED ORDER — METHYLPREDNISOLONE 4 MG PO TBPK
ORAL_TABLET | ORAL | 0 refills | Status: DC
Start: 2023-11-09 — End: 2023-12-21

## 2023-11-09 NOTE — Progress Notes (Signed)
   Chief Complaint  Patient presents with   Foot Pain    Flare of PF right - going on a trip to [place] [employer] end of May, flared up a couple months ago, would like an injection    New Patient (Initial Visit)    Est pt 2023    Subjective: 33 y.o. female presenting today for acute flareup of plantar fasciitis to the right foot.  Patient has a longstanding history of chronic plantar fasciitis intermittently.  Patient has been very busy over the last few months with work as well as work at home.   Past Medical History:  Diagnosis Date   Anxiety    situational   Asthma    Migraine    Past Surgical History:  Procedure Laterality Date   CESAREAN SECTION  2017   ELBOW SURGERY Right 2008   SHOULDER SURGERY Right 2009   TONSILLECTOMY     Allergies  Allergen Reactions   Other     Seasonal allergies     Objective: Physical Exam General: The patient is alert and oriented x3 in no acute distress.  Dermatology: Skin is warm, dry and supple bilateral lower extremities. Negative for open lesions or macerations bilateral.   Vascular: Dorsalis Pedis and Posterior Tibial pulses palpable bilateral.  Capillary fill time is immediate to all digits.  Neurological: Grossly intact via light touch  Musculoskeletal: Tenderness to palpation to the plantar aspect of the right heel along the plantar fascia. All other joints range of motion within normal limits bilateral. Strength 5/5 in all groups bilateral.  Tightness with ankle joint dorsiflexion along the posterior complex of the right leg consistent with gastrocnemius equinus  Radiographic exam RT ankle 07/02/2021: Normal osseous mineralization.  Joint spaces preserved.  No plantar heel spur noted.  Impression: Negative  Radiographic exam RT foot 12/31/2020: Normal osseous nose lesion.  Joint spaces preserved.  No irregularities.  No plantar heel spur noted.  Impression: Negative  Assessment: 1.  Chronic intermittent plantar fasciitis  right 2.  Equinus right lower extremity  Plan of Care:  -Patient evaluated.   -Recommend daily stretching exercises to alleviate calf pressure -Injection of 0.5cc Celestone soluspan injected into the right plantar fascia  -Rx for Medrol Dose Pack placed -Continue wearing good supportive shoes and sneakers.  Advise against going barefoot -Recommended that the patient go to [employer] for new shoes.  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  Going to [place] [employer] end of May 2025   Felecia Shelling, DPM [facility]  Dr. Felecia Shelling, DPM    [address] [place], [place] [address]                Office [phone]  Fax [phone]

## 2023-12-21 ENCOUNTER — Ambulatory Visit (INDEPENDENT_AMBULATORY_CARE_PROVIDER_SITE_OTHER): Admitting: Podiatry

## 2023-12-21 ENCOUNTER — Encounter: Payer: Self-pay | Admitting: Podiatry

## 2023-12-21 VITALS — Ht 67.0 in | Wt 168.0 lb

## 2023-12-21 DIAGNOSIS — M722 Plantar fascial fibromatosis: Secondary | ICD-10-CM | POA: Diagnosis not present

## 2023-12-21 MED ORDER — METHYLPREDNISOLONE 4 MG PO TBPK
ORAL_TABLET | ORAL | 0 refills | Status: DC
Start: 2023-12-21 — End: 2024-01-29

## 2023-12-21 MED ORDER — MELOXICAM 15 MG PO TABS: 15.0000 mg | ORAL_TABLET | Freq: Every day | ORAL | 1 refills | Status: AC

## 2023-12-23 DIAGNOSIS — M722 Plantar fascial fibromatosis: Secondary | ICD-10-CM | POA: Diagnosis not present

## 2023-12-23 MED ORDER — BETAMETHASONE SOD PHOS & ACET 6 (3-3) MG/ML IJ SUSP
3.0000 mg | Freq: Once | INTRAMUSCULAR | Status: AC
  Administered 2023-12-23: 3 mg via INTRA_ARTICULAR

## 2023-12-23 NOTE — Progress Notes (Signed)
   Chief Complaint  Patient presents with   Plantar Fasciitis    Patient is here for F/U for plantar fasciitis still having pain in ball of foot instead of heal would like another injection before going to [employer]    Subjective: 33 y.o. female presenting today for acute flareup of plantar fasciitis to the right foot.  Patient has a longstanding history of chronic plantar fasciitis intermittently.  Patient has been very busy over the last few months with work as well as work at home.   Past Medical History:  Diagnosis Date   Anxiety    situational   Asthma    Migraine    Past Surgical History:  Procedure Laterality Date   CESAREAN SECTION  2017   ELBOW SURGERY Right 2008   SHOULDER SURGERY Right 2009   TONSILLECTOMY     Allergies  Allergen Reactions   Other     Seasonal allergies     Objective: Physical Exam General: The patient is alert and oriented x3 in no acute distress.  Dermatology: Skin is warm, dry and supple bilateral lower extremities. Negative for open lesions or macerations bilateral.   Vascular: Dorsalis Pedis and Posterior Tibial pulses palpable bilateral.  Capillary fill time is immediate to all digits.  Neurological: Grossly intact via light touch  Musculoskeletal: Tenderness to palpation to the plantar aspect of the right heel along the plantar fascia. All other joints range of motion within normal limits bilateral. Strength 5/5 in all groups bilateral.  Tightness with ankle joint dorsiflexion along the posterior complex of the right leg consistent with gastrocnemius equinus  Radiographic exam RT ankle 07/02/2021: Normal osseous mineralization.  Joint spaces preserved.  No plantar heel spur noted.  Impression: Negative  Radiographic exam RT foot 12/31/2020: Normal osseous nose lesion.  Joint spaces preserved.  No irregularities.  No plantar heel spur noted.  Impression: Negative  Assessment: 1.  Chronic intermittent plantar fasciitis right 2.   Equinus right lower extremity  Plan of Care:  -Patient evaluated.   - Continue daily stretching exercises to alleviate calf pressure -Injection of 0.5cc Celestone  soluspan injected into the right plantar fascia  -Prescription for Medrol  Dosepak -Prescription for meloxicam  15 mg daily after completion of the Dosepak -Continue wearing good supportive shoes and sneakers.  Advise against going barefoot - Today we did discuss the possibility of surgical endoscopic plantar fasciotomy to alleviate her plantar fasciitis symptoms.  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  Going to [employer] end of May 2025   Dot Gazella, DPM [facility]  Dr. Dot Gazella, DPM    [address] [place], [place] [address]                Office [phone]  Fax [phone]

## 2024-01-29 ENCOUNTER — Ambulatory Visit (INDEPENDENT_AMBULATORY_CARE_PROVIDER_SITE_OTHER): Admitting: Nurse Practitioner

## 2024-01-29 ENCOUNTER — Encounter: Payer: Self-pay | Admitting: Nurse Practitioner

## 2024-01-29 VITALS — BP 102/64 | HR 71 | Temp 98.0°F | Ht 67.0 in | Wt 160.8 lb

## 2024-01-29 DIAGNOSIS — F411 Generalized anxiety disorder: Secondary | ICD-10-CM

## 2024-01-29 DIAGNOSIS — E663 Overweight: Secondary | ICD-10-CM | POA: Diagnosis not present

## 2024-01-29 NOTE — Assessment & Plan Note (Signed)
 Her anxiety is well-managed, with hydroxyzine  used only twice since prescribed. Continue hydroxyzine  25mg  TID as needed.

## 2024-01-29 NOTE — Patient Instructions (Signed)
 It was great to see you!  Keep up the great work!  Let's follow-up in 4 months, sooner if you have concerns.  If a referral was placed today, you will be contacted for an appointment. Please note that routine referrals can sometimes take up to 3-4 weeks to process. Please call our office if you haven't heard anything after this time frame.  Take care,  Rodman Pickle, NP

## 2024-01-29 NOTE — Progress Notes (Signed)
 Established Patient Office Visit  Subjective   Patient ID: Erin Pratt, female    DOB: 13-Sep-1990  Age: 33 y.o. MRN: [id number]  Chief Complaint  Patient presents with   Weight Management    Follow up, anxiety    HPI Discussed the use of AI scribe software for clinical note transcription with the patient, who gave verbal consent to proceed.  History of Present Illness   Erin Pratt is a 33 year old female who presents for weight management follow-up.  She has lost eight pounds since her last visit and is nearing her goal weight. She administers zepbound  2.5mg  every other week. Insurance coverage for her current medication will end on July 1st, requiring a switch to [facility]. She has ordered another month's supply of her current medication and plans to stop after it is exhausted.  Her anxiety is manageable, and she has used the hydroxyzine  twice as needed in stressful situations.        01/29/2024    8:30 AM 06/05/2023    8:25 AM 03/06/2023   10:01 AM 01/16/2020    2:28 PM 05/19/2019    7:58 AM  Depression screen PHQ 2/9  Decreased Interest 0 1 0 0 1  Down, Depressed, Hopeless 0 1 1 1 1   PHQ - 2 Score 0 2 1 1 2   Altered sleeping 1 0 2  3  Tired, decreased energy 1 1 2  1   Change in appetite 0 1 2  2   Feeling bad or failure about yourself  0 0 0  2  Trouble concentrating 0 0 0  1  Moving slowly or fidgety/restless 0 0 0  0  Suicidal thoughts 0 0 0  0  PHQ-9 Score 2 4 7  11   Difficult doing work/chores Not difficult at all Not difficult at all Not difficult at all  Not difficult at all      01/29/2024    8:30 AM 06/05/2023    8:26 AM 03/06/2023   10:01 AM 01/16/2020    2:36 PM  GAD 7 : Generalized Anxiety Score  Nervous, Anxious, on [place] 1 1 2  0  Control/stop worrying 0 1 2 1   Worry too much - different things 0 0 1   Trouble relaxing 0 1 2 0  Restless 0 0 0 0  Easily annoyed or irritable 0 1 0 1  Afraid - awful might happen 0 1 1 1   Total GAD 7 Score 1 5 8     Anxiety Difficulty Not difficult at all Not difficult at all Not difficult at all Somewhat difficult      ROS See pertinent positives and negatives per HPI.    Objective:     BP 102/64 (BP Location: Left Arm, Patient Position: Sitting, Cuff Size: Normal)   Pulse 71   Temp 98 F (36.7 C)   Ht 5' 7 (1.702 m)   Wt 160 lb 12.8 oz (72.9 kg)   SpO2 99%   BMI 25.18 kg/m  BP Readings from Last 3 Encounters:  01/29/24 102/64  10/16/23 108/68  07/06/23 106/62   Wt Readings from Last 3 Encounters:  01/29/24 160 lb 12.8 oz (72.9 kg)  12/21/23 168 lb (76.2 kg)  10/16/23 168 lb (76.2 kg)      Physical Exam Vitals and nursing note reviewed.  Constitutional:      General: She is not in acute distress.    Appearance: Normal appearance.  HENT:     Head: Normocephalic.  Eyes:     Conjunctiva/sclera: Conjunctivae normal.    Cardiovascular:     Rate and Rhythm: Normal rate and regular rhythm.     Pulses: Normal pulses.     Heart sounds: Normal heart sounds.  Pulmonary:     Effort: Pulmonary effort is normal.     Breath sounds: Normal breath sounds.   Musculoskeletal:     Cervical back: Normal range of motion.   Skin:    General: Skin is warm.   Neurological:     General: No focal deficit present.     Mental Status: She is alert and oriented to person, place, and time.   Psychiatric:        Mood and Affect: Mood normal.        Behavior: Behavior normal.        Thought Content: Thought content normal.        Judgment: Judgment normal.      Assessment & Plan:   Problem List Items Addressed This Visit       Other   GAD (generalized anxiety disorder) - Primary   Her anxiety is well-managed, with hydroxyzine  used only twice since prescribed. Continue hydroxyzine  25mg  TID as needed.       Overweight   She is successfully losing weight and nearing her goal. Continue zepbound  2.5 mg injections every other week until the supply is depleted. Insurance requires  switching to [facility] starting July 1st. Switch to [facility] if issues arise after stopping the current medication.      Return in about 4 months (around 05/30/2024) for CPE.    Odette Benjamin, NP

## 2024-01-29 NOTE — Assessment & Plan Note (Signed)
 She is successfully losing weight and nearing her goal. Continue zepbound  2.5 mg injections every other week until the supply is depleted. Insurance requires switching to [facility] starting July 1st. Switch to [facility] if issues arise after stopping the current medication.

## 2024-02-04 ENCOUNTER — Other Ambulatory Visit (HOSPITAL_COMMUNITY): Payer: Self-pay

## 2024-02-04 ENCOUNTER — Telehealth: Payer: Self-pay

## 2024-02-04 NOTE — Telephone Encounter (Signed)
 Per message from 02/03/24, Zepbound  is no longer available for this patient because they have exhausted their GLP1 fill limit.

## 2024-02-04 NOTE — Telephone Encounter (Signed)
 Pharmacy Patient Advocate Encounter   Received notification from CoverMyMeds that prior authorization for Zepbound  2.5 is required/requested.   Insurance verification completed.   The patient is insured through [facility] .   Per test claim: patient has reached GLP1 fill limit  Patient chart notes say switching to [facility]. Please advise         Overweight    She is successfully losing weight and nearing her goal. Continue zepbound  2.5 mg injections every other week until the supply is depleted. Insurance requires switching to [facility] starting July 1st. Switch to [facility] if issues arise after stopping the current medication.

## 2024-02-05 NOTE — Telephone Encounter (Signed)
 Noted

## 2024-02-23 ENCOUNTER — Telehealth: Payer: Self-pay

## 2024-02-23 NOTE — Telephone Encounter (Signed)
 I called and spoke with patient and she does not want to start the [facility] and will call back if she decides to.

## 2024-02-23 NOTE — Telephone Encounter (Signed)
 Pharmacy sent a request for [facility] 0.25mg .       I called patient and left message to return call

## 2024-03-01 NOTE — Telephone Encounter (Signed)
 Noted

## 2024-06-06 ENCOUNTER — Encounter: Payer: Self-pay | Admitting: Nurse Practitioner

## 2024-06-06 ENCOUNTER — Ambulatory Visit: Payer: Self-pay | Admitting: Nurse Practitioner

## 2024-06-06 ENCOUNTER — Ambulatory Visit: Admitting: Nurse Practitioner

## 2024-06-06 VITALS — BP 102/66 | HR 76 | Ht 67.0 in | Wt 154.8 lb

## 2024-06-06 DIAGNOSIS — F411 Generalized anxiety disorder: Secondary | ICD-10-CM

## 2024-06-06 DIAGNOSIS — Z136 Encounter for screening for cardiovascular disorders: Secondary | ICD-10-CM | POA: Diagnosis not present

## 2024-06-06 DIAGNOSIS — Z0000 Encounter for general adult medical examination without abnormal findings: Secondary | ICD-10-CM | POA: Diagnosis not present

## 2024-06-06 DIAGNOSIS — J4520 Mild intermittent asthma, uncomplicated: Secondary | ICD-10-CM | POA: Diagnosis not present

## 2024-06-06 LAB — COMPREHENSIVE METABOLIC PANEL WITH GFR
ALT: 7 U/L (ref 0–35)
AST: 14 U/L (ref 0–37)
Albumin: 4.4 g/dL (ref 3.5–5.2)
Alkaline Phosphatase: 62 U/L (ref 39–117)
BUN: 9 mg/dL (ref 6–23)
CO2: 26 meq/L (ref 19–32)
Calcium: 9 mg/dL (ref 8.4–10.5)
Chloride: 106 meq/L (ref 96–112)
Creatinine, Ser: 0.73 mg/dL (ref 0.40–1.20)
GFR: 107.98 mL/min (ref >60.00)
Glucose, Bld: 72 mg/dL (ref 70–99)
Potassium: 3.7 meq/L (ref 3.5–5.1)
Sodium: 138 meq/L (ref 135–145)
Total Bilirubin: 0.5 mg/dL (ref 0.2–1.2)
Total Protein: 7.1 g/dL (ref 6.0–8.3)

## 2024-06-06 LAB — CBC WITH DIFFERENTIAL/PLATELET
Basophils Absolute: 0.1 K/uL (ref 0.0–0.1)
Basophils Relative: 1.2 % (ref 0.0–3.0)
Eosinophils Absolute: 0.1 K/uL (ref 0.0–0.7)
Eosinophils Relative: 2.5 % (ref 0.0–5.0)
HCT: 37.1 % (ref 36.0–46.0)
Hemoglobin: 12.6 g/dL (ref 12.0–15.0)
Lymphocytes Relative: 29.4 % (ref 12.0–46.0)
Lymphs Abs: 1.3 K/uL (ref 0.7–4.0)
MCHC: 33.8 g/dL (ref 30.0–36.0)
MCV: 94.6 fl (ref 78.0–100.0)
Monocytes Absolute: 0.3 K/uL (ref 0.1–1.0)
Monocytes Relative: 7.1 % (ref 3.0–12.0)
Neutro Abs: 2.7 K/uL (ref 1.4–7.7)
Neutrophils Relative %: 59.8 % (ref 43.0–77.0)
Platelets: 247 K/uL (ref 150.0–400.0)
RBC: 3.92 Mil/uL (ref 3.87–5.11)
RDW: 13.1 % (ref 11.5–15.5)
WBC: 4.5 K/uL (ref 4.0–10.5)

## 2024-06-06 LAB — LIPID PANEL
Cholesterol: 85 mg/dL (ref 0–200)
HDL: 45 mg/dL (ref >39.00)
LDL Cholesterol: 32 mg/dL (ref 0–99)
NonHDL: 39.63
Total CHOL/HDL Ratio: 2
Triglycerides: 36 mg/dL (ref 0.0–149.0)
VLDL: 7.2 mg/dL (ref 0.0–40.0)

## 2024-06-06 LAB — TSH: TSH: 1.41 u[IU]/mL (ref 0.35–5.50)

## 2024-06-06 NOTE — Progress Notes (Signed)
 BP 102/66 (BP Location: Left Arm, Patient Position: Sitting, Cuff Size: Normal)   Pulse 76   Ht 5' 7 (1.702 m)   Wt 154 lb 12.8 oz (70.2 kg)   LMP 06/03/2024 (Approximate)   SpO2 100%   BMI 24.25 kg/m    Subjective:    Patient ID: Erin Pratt, female    DOB: 15-Mar-1991, 33 y.o.   MRN: [id number]  CC: Chief Complaint  Patient presents with   Annual Exam    Patient is fasting, no concerns    HPI: Erin Pratt is a 33 y.o. female presenting on 06/06/2024 for comprehensive medical examination. Current medical complaints include:none  She currently lives with: fiance, son Menopausal Symptoms: no  Depression and Anxiety Screen done today and results listed below:     06/06/2024    8:59 AM 01/29/2024    8:30 AM 06/05/2023    8:25 AM 03/06/2023   10:01 AM 01/16/2020    2:28 PM  Depression screen PHQ 2/9  Decreased Interest 0 0 1 0 0  Down, Depressed, Hopeless 0 0 1 1 1   PHQ - 2 Score 0 0 2 1 1   Altered sleeping 0 1 0 2   Tired, decreased energy 1 1 1 2    Change in appetite 0 0 1 2   Feeling bad or failure about yourself  0 0 0 0   Trouble concentrating 0 0 0 0   Moving slowly or fidgety/restless 0 0 0 0   Suicidal thoughts 0 0 0 0   PHQ-9 Score 1 2 4 7    Difficult doing work/chores Not difficult at all Not difficult at all Not difficult at all Not difficult at all       06/06/2024    8:59 AM 01/29/2024    8:30 AM 06/05/2023    8:26 AM 03/06/2023   10:01 AM  GAD 7 : Generalized Anxiety Score  Nervous, Anxious, on [place] 0 1 1 2   Control/stop worrying 0 0 1 2  Worry too much - different things 0 0 0 1  Trouble relaxing 0 0 1 2  Restless 0 0 0 0  Easily annoyed or irritable 0 0 1 0  Afraid - awful might happen 0 0 1 1  Total GAD 7 Score 0 1 5 8   Anxiety Difficulty Not difficult at all Not difficult at all Not difficult at all Not difficult at all    The patient does not have a history of falls. I did not complete a risk assessment for falls. A plan of care for  falls was not documented.   Past Medical History:  Past Medical History:  Diagnosis Date   Anxiety    situational   Asthma    Migraine     Surgical History:  Past Surgical History:  Procedure Laterality Date   CESAREAN SECTION  2017   ELBOW SURGERY Right 2008   SHOULDER SURGERY Right 2009   TONSILLECTOMY      Medications:  Current Outpatient Medications on File Prior to Visit  Medication Sig   albuterol  (PROVENTIL  HFA;VENTOLIN  HFA) 108 (90 Base) MCG/ACT inhaler INHALE 2 PUFFS INTO THE LUNGS EVERY 4 (FOUR) HOURS AS NEEDED FOR WHEEZING OR SHORTNESS OF BREATH.   azelastine  (ASTELIN ) 0.1 % nasal spray Place 1 spray into both nostrils 2 (two) times daily. Use in each nostril as directed (Patient taking differently: Place 1 spray into both nostrils as needed. Use in each nostril as directed)   hydrOXYzine  (VISTARIL ) 25 MG  capsule Take 1 capsule (25 mg total) by mouth every 8 (eight) hours as needed.   ibuprofen (ADVIL) 800 MG tablet Take 1 tablet by mouth as needed.   levonorgestrel (MIRENA, 52 MG,) 20 MCG/DAY IUD 1 each by Intrauterine route once.   No current facility-administered medications on file prior to visit.    Allergies:  Allergies  Allergen Reactions   Other     Seasonal allergies    Social History:  Social History   Socioeconomic History   Marital status: Divorced    Spouse name: Not on file   Number of children: 1   Years of education: BA   Highest education level: Not on file  Occupational History   Occupation: asst print production planner  Tobacco Use   Smoking status: Never   Smokeless tobacco: Never  Vaping Use   Vaping status: Never Used  Substance and Sexual Activity   Alcohol use: No   Drug use: No   Sexual activity: Not Currently    Birth control/protection: I.U.D.  Other Topics Concern   Not on file  Social History Narrative   Not on file   Social Drivers of Health   Financial Resource Strain: Not on file  Food Insecurity: Not on file   Transportation Needs: Not on file  Physical Activity: Not on file  Stress: Not on file  Social Connections: Not on file  Intimate Partner Violence: Not on file   Social History   Tobacco Use  Smoking Status Never  Smokeless Tobacco Never   Social History   Substance and Sexual Activity  Alcohol Use No    Family History:  Family History  Problem Relation Age of Onset   Diabetes Mother    Asthma Mother    Hypertension Mother    Arthritis Mother    Asthma Father    Asthma Sister    Asthma Brother    Arthritis Maternal Grandmother        RA   Hypertension Maternal Grandmother    Cancer - Lung Maternal Grandfather     Past medical history, surgical history, medications, allergies, family history and social history reviewed with patient today and changes made to appropriate areas of the chart.   Review of Systems  Constitutional: Negative.   HENT: Negative.    Eyes: Negative.   Respiratory: Negative.    Cardiovascular: Negative.   Gastrointestinal: Negative.   Genitourinary: Negative.   Musculoskeletal: Negative.   Skin: Negative.   Neurological: Negative.   Psychiatric/Behavioral: Negative.     All other ROS negative except what is listed above and in the HPI.      Objective:    BP 102/66 (BP Location: Left Arm, Patient Position: Sitting, Cuff Size: Normal)   Pulse 76   Ht 5' 7 (1.702 m)   Wt 154 lb 12.8 oz (70.2 kg)   LMP 06/03/2024 (Approximate)   SpO2 100%   BMI 24.25 kg/m   Wt Readings from Last 3 Encounters:  06/06/24 154 lb 12.8 oz (70.2 kg)  01/29/24 160 lb 12.8 oz (72.9 kg)  12/21/23 168 lb (76.2 kg)    Physical Exam Vitals and nursing note reviewed.  Constitutional:      General: She is not in acute distress.    Appearance: Normal appearance.  HENT:     Head: Normocephalic and atraumatic.     Right Ear: Tympanic membrane, ear canal and external ear normal.     Left Ear: Tympanic membrane, ear canal and external ear normal.  Mouth/Throat:     Mouth: Mucous membranes are moist.     Pharynx: No posterior oropharyngeal erythema.  Eyes:     Conjunctiva/sclera: Conjunctivae normal.  Cardiovascular:     Rate and Rhythm: Normal rate and regular rhythm.     Pulses: Normal pulses.     Heart sounds: Normal heart sounds.  Pulmonary:     Effort: Pulmonary effort is normal.     Breath sounds: Normal breath sounds.  Abdominal:     Palpations: Abdomen is soft.     Tenderness: There is no abdominal tenderness.  Musculoskeletal:        General: Normal range of motion.     Cervical back: Normal range of motion and neck supple.     Right lower leg: No edema.     Left lower leg: No edema.  Lymphadenopathy:     Cervical: No cervical adenopathy.  Skin:    General: Skin is warm and dry.  Neurological:     General: No focal deficit present.     Mental Status: She is alert and oriented to person, place, and time.     Cranial Nerves: No cranial nerve deficit.     Coordination: Coordination normal.     Gait: Gait normal.  Psychiatric:        Mood and Affect: Mood normal.        Behavior: Behavior normal.        Thought Content: Thought content normal.        Judgment: Judgment normal.     Results for orders placed or performed in visit on 06/05/23  CBC with Differential/Platelet   Collection Time: 06/05/23  8:54 AM  Result Value Ref Range   WBC 5.5 4.0 - 10.5 K/uL   RBC 4.18 3.87 - 5.11 Mil/uL   Hemoglobin 12.7 12.0 - 15.0 g/dL   HCT 61.9 63.9 - 53.9 %   MCV 91.0 78.0 - 100.0 fl   MCHC 33.4 30.0 - 36.0 g/dL   RDW 87.0 88.4 - 84.4 %   Platelets 305.0 150.0 - 400.0 K/uL   Neutrophils Relative % 53.0 43.0 - 77.0 %   Lymphocytes Relative 36.7 12.0 - 46.0 %   Monocytes Relative 6.5 3.0 - 12.0 %   Eosinophils Relative 3.0 0.0 - 5.0 %   Basophils Relative 0.8 0.0 - 3.0 %   Neutro Abs 2.9 1.4 - 7.7 K/uL   Lymphs Abs 2.0 0.7 - 4.0 K/uL   Monocytes Absolute 0.4 0.1 - 1.0 K/uL   Eosinophils Absolute 0.2 0.0 - 0.7  K/uL   Basophils Absolute 0.0 0.0 - 0.1 K/uL  Comprehensive metabolic panel   Collection Time: 06/05/23  8:54 AM  Result Value Ref Range   Sodium 140 135 - 145 mEq/L   Potassium 4.0 3.5 - 5.1 mEq/L   Chloride 105 96 - 112 mEq/L   CO2 27 19 - 32 mEq/L   Glucose, Bld 85 70 - 99 mg/dL   BUN 11 6 - 23 mg/dL   Creatinine, Ser 9.20 0.40 - 1.20 mg/dL   Total Bilirubin 0.7 0.2 - 1.2 mg/dL   Alkaline Phosphatase 78 39 - 117 U/L   AST 11 0 - 37 U/L   ALT 6 0 - 35 U/L   Total Protein 7.1 6.0 - 8.3 g/dL   Albumin 4.4 3.5 - 5.2 g/dL   GFR 01.07 >39.99 mL/min   Calcium 9.2 8.4 - 10.5 mg/dL  Lipid panel   Collection Time: 06/05/23  8:54 AM  Result Value Ref Range   Cholesterol 94 0 - 200 mg/dL   Triglycerides 54.9 0.0 - 149.0 mg/dL   HDL 49.09 >60.99 mg/dL   VLDL 9.0 0.0 - 59.9 mg/dL   LDL Cholesterol 34 0 - 99 mg/dL   Total CHOL/HDL Ratio 2    NonHDL 42.66   TSH   Collection Time: 06/05/23  8:54 AM  Result Value Ref Range   TSH 1.54 0.35 - 5.50 uIU/mL      Assessment & Plan:   Problem List Items Addressed This Visit       Respiratory   Mild intermittent asthma without complication   Chronic, stable.  Continue albuterol  inhaler as needed for symptoms. Declines pneumonia vaccine today        Other   GAD (generalized anxiety disorder)   Her anxiety is well-managed, with hydroxyzine  used only twice since prescribed. Continue hydroxyzine  25mg  TID as needed.       Routine general medical examination at a health care facility - Primary   Health maintenance reviewed and updated. Discussed nutrition, exercise. Check CMP, CBC, TSH today. Follow-up 1 year.        Relevant Orders   CBC with Differential/Platelet   Comprehensive metabolic panel with GFR   TSH   Other Visit Diagnoses       Screening for cardiovascular condition       Screen lipid panel today   Relevant Orders   Lipid panel        Follow up plan: Return in about 1 year (around 06/06/2025) for  CPE.   LABORATORY TESTING:  - Pap smear: up to date  IMMUNIZATIONS:   - Tdap: Tetanus vaccination status reviewed: last tetanus booster within 10 years. - Influenza: Declined - Pneumovax: Not applicable - Prevnar: Declined - HPV: Declined - Shingrix vaccine: Not applicable  SCREENING: -Mammogram: Not applicable  - Colonoscopy: Not applicable  - Bone Density: Not applicable   PATIENT COUNSELING:   Advised to take 1 mg of folate supplement per day if capable of pregnancy.   Sexuality: Discussed sexually transmitted diseases, partner selection, use of condoms, avoidance of unintended pregnancy  and contraceptive alternatives.   Advised to avoid cigarette smoking.  I discussed with the patient that most people either abstain from alcohol or drink within safe limits (<=14/week and <=4 drinks/occasion for males, <=7/weeks and <= 3 drinks/occasion for females) and that the risk for alcohol disorders and other health effects rises proportionally with the number of drinks per week and how often a drinker exceeds daily limits.  Discussed cessation/primary prevention of drug use and availability of treatment for abuse.   Diet: Encouraged to adjust caloric intake to maintain  or achieve ideal body weight, to reduce intake of dietary saturated fat and total fat, to limit sodium intake by avoiding high sodium foods and not adding table salt, and to maintain adequate dietary potassium and calcium preferably from fresh fruits, vegetables, and low-fat dairy products.    stressed the importance of regular exercise  Injury prevention: Discussed safety belts, safety helmets, smoke detector, smoking near bedding or upholstery.   Dental health: Discussed importance of regular tooth brushing, flossing, and dental visits.    NEXT PREVENTATIVE PHYSICAL DUE IN 1 YEAR. Return in about 1 year (around 06/06/2025) for CPE.  Iliyana Convey A Kerrigan Gombos

## 2024-06-06 NOTE — Assessment & Plan Note (Signed)
Health maintenance reviewed and updated. Discussed nutrition, exercise. Check CMP, CBC, TSH today. Follow-up 1 year.   

## 2024-06-06 NOTE — Assessment & Plan Note (Signed)
 Chronic, stable.  Continue albuterol  inhaler as needed for symptoms. Declines pneumonia vaccine today

## 2024-06-06 NOTE — Assessment & Plan Note (Signed)
 Her anxiety is well-managed, with hydroxyzine  used only twice since prescribed. Continue hydroxyzine  25mg  TID as needed.

## 2024-06-06 NOTE — Patient Instructions (Signed)
It was great to see you!  We are checking your labs today and will let you know the results via mychart/phone.   Keep up the great work!   Let's follow-up in 1 year, sooner if you have concerns.  If a referral was placed today, you will be contacted for an appointment. Please note that routine referrals can sometimes take up to 3-4 weeks to process. Please call our office if you haven't heard anything after this time frame.  Take care,  Nivin Braniff, NP  

## 2024-08-14 ENCOUNTER — Encounter: Payer: Self-pay | Admitting: Nurse Practitioner

## 2024-08-15 ENCOUNTER — Encounter (HOSPITAL_BASED_OUTPATIENT_CLINIC_OR_DEPARTMENT_OTHER): Payer: Self-pay

## 2024-08-15 ENCOUNTER — Emergency Department (HOSPITAL_BASED_OUTPATIENT_CLINIC_OR_DEPARTMENT_OTHER)
Admission: EM | Admit: 2024-08-15 | Discharge: 2024-08-15 | Disposition: A | Discharge status: Home / Self Care | Source: Ambulatory Visit | Attending: Emergency Medicine | Admitting: Emergency Medicine

## 2024-08-15 ENCOUNTER — Emergency Department (HOSPITAL_BASED_OUTPATIENT_CLINIC_OR_DEPARTMENT_OTHER)

## 2024-08-15 ENCOUNTER — Emergency Department (HOSPITAL_BASED_OUTPATIENT_CLINIC_OR_DEPARTMENT_OTHER): Admitting: Radiology

## 2024-08-15 ENCOUNTER — Other Ambulatory Visit: Payer: Self-pay

## 2024-08-15 ENCOUNTER — Ambulatory Visit: Payer: Self-pay

## 2024-08-15 DIAGNOSIS — J45909 Unspecified asthma, uncomplicated: Secondary | ICD-10-CM | POA: Insufficient documentation

## 2024-08-15 DIAGNOSIS — K8020 Calculus of gallbladder without cholecystitis without obstruction: Secondary | ICD-10-CM | POA: Insufficient documentation

## 2024-08-15 DIAGNOSIS — R509 Fever, unspecified: Secondary | ICD-10-CM | POA: Insufficient documentation

## 2024-08-15 DIAGNOSIS — R102 Pelvic and perineal pain unspecified side: Secondary | ICD-10-CM

## 2024-08-15 LAB — URINALYSIS, MICROSCOPIC (REFLEX)
Bacteria, UA: NONE SEEN
WBC, UA: >50 WBC/hpf (ref 0–5)

## 2024-08-15 LAB — CBC
HCT: 41.8 % (ref 36.0–46.0)
Hemoglobin: 14.6 g/dL (ref 12.0–15.0)
MCH: 32 pg (ref 26.0–34.0)
MCHC: 34.9 g/dL (ref 30.0–36.0)
MCV: 91.7 fL (ref 80.0–100.0)
Platelets: 182 K/uL (ref 150–400)
RBC: 4.56 MIL/uL (ref 3.87–5.11)
RDW: 11.9 % (ref 11.5–15.5)
WBC: 5.1 K/uL (ref 4.0–10.5)
nRBC: 0 % (ref 0.0–0.2)

## 2024-08-15 LAB — URINALYSIS, ROUTINE W REFLEX MICROSCOPIC
Bilirubin Urine: NEGATIVE
Glucose, UA: NEGATIVE mg/dL
Ketones, ur: NEGATIVE mg/dL
Nitrite: NEGATIVE
Protein, ur: 100 mg/dL — AB
Specific Gravity, Urine: 1.025 (ref 1.005–1.030)
pH: 6 (ref 5.0–8.0)

## 2024-08-15 LAB — PREGNANCY, URINE: Preg Test, Ur: NEGATIVE

## 2024-08-15 LAB — TROPONIN T, HIGH SENSITIVITY
Troponin T High Sensitivity: <15 ng/L (ref 0–19)
Troponin T High Sensitivity: <15 ng/L (ref 0–19)

## 2024-08-15 LAB — WET PREP, GENITAL
Clue Cells Wet Prep HPF POC: NONE SEEN
Sperm: NONE SEEN
Trich, Wet Prep: NONE SEEN
WBC, Wet Prep HPF POC: <10
Yeast Wet Prep HPF POC: NONE SEEN

## 2024-08-15 LAB — HEPATIC FUNCTION PANEL
ALT: 41 U/L (ref 0–44)
AST: 34 U/L (ref 15–41)
Albumin: 3.8 g/dL (ref 3.5–5.0)
Alkaline Phosphatase: 45 U/L (ref 38–126)
Bilirubin, Direct: 0.1 mg/dL (ref 0.0–0.2)
Indirect Bilirubin: 0.2 mg/dL — ABNORMAL LOW (ref 0.3–0.9)
Total Bilirubin: 0.3 mg/dL (ref 0.0–1.2)
Total Protein: 6.8 g/dL (ref 6.5–8.1)

## 2024-08-15 LAB — T4, FREE: Free T4: 1.17 ng/dL (ref 0.80–2.00)

## 2024-08-15 LAB — BASIC METABOLIC PANEL WITH GFR
Anion gap: 13 (ref 5–15)
BUN: 8 mg/dL (ref 6–20)
CO2: 23 mmol/L (ref 22–32)
Calcium: 9 mg/dL (ref 8.9–10.3)
Chloride: 99 mmol/L (ref 98–111)
Creatinine, Ser: 0.96 mg/dL (ref 0.44–1.00)
GFR, Estimated: >60 mL/min
Glucose, Bld: 128 mg/dL — ABNORMAL HIGH (ref 70–99)
Potassium: 3.4 mmol/L — ABNORMAL LOW (ref 3.5–5.1)
Sodium: 136 mmol/L (ref 135–145)

## 2024-08-15 LAB — RESP PANEL BY RT-PCR (RSV, FLU A&B, COVID)  RVPGX2
Influenza A by PCR: NEGATIVE
Influenza B by PCR: NEGATIVE
Resp Syncytial Virus by PCR: NEGATIVE
SARS Coronavirus 2 by RT PCR: NEGATIVE

## 2024-08-15 LAB — HIV ANTIBODY (ROUTINE TESTING W REFLEX): HIV Screen 4th Generation wRfx: NONREACTIVE

## 2024-08-15 LAB — D-DIMER, QUANTITATIVE: D-Dimer, Quant: 1.87 ug{FEU}/mL — ABNORMAL HIGH (ref 0.00–0.50)

## 2024-08-15 LAB — LIPASE, BLOOD: Lipase: 31 U/L (ref 11–51)

## 2024-08-15 LAB — TSH: TSH: 1.06 u[IU]/mL (ref 0.350–4.500)

## 2024-08-15 MED ORDER — DOXYCYCLINE HYCLATE 100 MG PO CAPS: 100.0000 mg | ORAL_CAPSULE | Freq: Two times a day (BID) | ORAL | 0 refills | Status: DC

## 2024-08-15 MED ORDER — LIDOCAINE HCL (PF) 1 % IJ SOLN
1.0000 mL | Freq: Once | INTRAMUSCULAR | Status: AC
  Administered 2024-08-15: 1 mL
  Filled 2024-08-15: qty 5

## 2024-08-15 MED ORDER — DOXYCYCLINE HYCLATE 100 MG PO TABS
100.0000 mg | ORAL_TABLET | Freq: Once | ORAL | Status: AC
  Administered 2024-08-15: 100 mg via ORAL
  Filled 2024-08-15: qty 1

## 2024-08-15 MED ORDER — IOHEXOL 350 MG/ML SOLN
100.0000 mL | Freq: Once | INTRAVENOUS | Status: AC | PRN
  Administered 2024-08-15: 100 mL via INTRAVENOUS

## 2024-08-15 MED ORDER — CEFTRIAXONE SODIUM 500 MG IJ SOLR
500.0000 mg | Freq: Once | INTRAMUSCULAR | Status: AC
  Administered 2024-08-15: 500 mg via INTRAMUSCULAR

## 2024-08-15 MED ORDER — FENTANYL CITRATE (PF) 50 MCG/ML IJ SOSY
50.0000 ug | PREFILLED_SYRINGE | Freq: Once | INTRAMUSCULAR | Status: AC
  Administered 2024-08-15: 50 ug via INTRAVENOUS
  Filled 2024-08-15: qty 1

## 2024-08-15 MED ORDER — SODIUM CHLORIDE 0.9 % IV BOLUS
1000.0000 mL | Freq: Once | INTRAVENOUS | Status: AC
  Administered 2024-08-15: 1000 mL via INTRAVENOUS

## 2024-08-15 NOTE — ED Notes (Signed)
 DC paperwork given and verbally understood.

## 2024-08-15 NOTE — ED Provider Notes (Signed)
 Symptomatic cholelithiasis as well as lower abd pain.  Purulence around the IUD today and has been checked for gc/chlamydia and treated.  But could be some type of allergic cause from resuming sexual intercourse after being inactive for over 5 years possibly a new product that she is using.  Gen surgery recommended LFT's and RUQ U/S and if normal then home with doxy for 1 week. Patient's LFTs are within normal limits, wet prep was normal, patient's ultrasound shows 1 gallstone at 1.3 cm but no signs of acute cholecystitis.  Discussed all this with the patient and her family.  At this time it appears the patient is stable for discharge home.  Will do a week course of doxycycline  but also discussed return precautions, follow-up with general surgery and her OB/GYN if symptoms continued.   Doretha Folks, MD 08/15/24 1753

## 2024-08-15 NOTE — Telephone Encounter (Signed)
 Noted.  Patient at ER now. Dm/cma

## 2024-08-15 NOTE — Discharge Instructions (Addendum)
 You will take your next dose of antibiotic tomorrow morning.  Avoid any sexual encounters until you have completed the antibiotic and your symptoms have resolved.  Follow-up with your OB/GYN if symptoms are not improving.  If you start having significantly worsening right lower quadrant pain, vomiting, persistently high fevers return to the emergency room.  Otherwise plan on following up with surgery for the gallbladder.

## 2024-08-15 NOTE — ED Provider Notes (Signed)
 " [place] [facility] AT [facility] Provider Note   CSN: [id number] Arrival date & time: 08/15/24  9158     Patient presents with: Chest Pain and Weakness   Erin Pratt is a 34 y.o. female.    Chest Pain Associated symptoms: abdominal pain, fever and weakness   Weakness Associated symptoms: abdominal pain, chest pain, dysuria and fever      34 year old female with medical history significant for asthma, anxiety presenting to the emergency department with multiple complaints.  The patient states that she has been having chest pain, pleuritic in nature, worse with taking a deep breath,, right sided in nature.  Pain radiates to her right shoulder and neck.  She feels generalized fatigue and weakness.  She experienced a fever last night greater than 101.  Her symptoms started this past Saturday.  She also endorses some epigastric discomfort.  Additionally, she endorses burning with urination, has had some vaginal discharge which is abnormal, endorses crampy discomfort in her pelvis, predominantly on the left.  Request to be tested for STIs stating I am recently married and I would like to be evaluated for STIs.  Prior to Admission medications  Medication Sig Start Date End Date Taking? Authorizing Provider  JUNEL FE 1/20 1-20 MG-MCG tablet Take 1 tablet by mouth daily. 06/07/24  Yes [provider]  albuterol  (PROVENTIL  HFA;VENTOLIN  HFA) 108 (90 Base) MCG/ACT inhaler INHALE 2 PUFFS INTO THE LUNGS EVERY 4 (FOUR) HOURS AS NEEDED FOR WHEEZING OR SHORTNESS OF BREATH. 11/08/18   Mercer Clotilda SAUNDERS, MD  azelastine  (ASTELIN ) 0.1 % nasal spray Place 1 spray into both nostrils 2 (two) times daily. Use in each nostril as directed Patient taking differently: Place 1 spray into both nostrils as needed. Use in each nostril as directed 07/20/22   Vivienne Delon HERO, PA-C  hydrOXYzine  (VISTARIL ) 25 MG capsule Take 1 capsule (25 mg total) by mouth every 8 (eight) hours as  needed. 10/16/23   McElwee, Lauren A, NP  ibuprofen (ADVIL) 800 MG tablet Take 1 tablet by mouth as needed.    [provider]  levonorgestrel (MIRENA, 52 MG,) 20 MCG/DAY IUD 1 each by Intrauterine route once. 08/12/16   [provider]    Allergies: Other    Review of Systems  Constitutional:  Positive for fever.  Cardiovascular:  Positive for chest pain.  Gastrointestinal:  Positive for abdominal pain.  Genitourinary:  Positive for dysuria.  Neurological:  Positive for weakness.  All other systems reviewed and are negative.   Updated Vital Signs BP 112/69   Pulse 79   Temp 98.1 F (36.7 C) (Oral)   Resp 12   SpO2 100%   Physical Exam Vitals and nursing note reviewed. Exam conducted with a chaperone present.  Constitutional:      General: She is not in acute distress.    Appearance: She is well-developed.  HENT:     Head: Normocephalic and atraumatic.  Eyes:     Conjunctiva/sclera: Conjunctivae normal.  Cardiovascular:     Rate and Rhythm: Regular rhythm. Tachycardia present.  Pulmonary:     Effort: Pulmonary effort is normal. No respiratory distress.     Breath sounds: Normal breath sounds.  Abdominal:     Palpations: Abdomen is soft.     Tenderness: There is abdominal tenderness in the right lower quadrant, periumbilical area, suprapubic area and left lower quadrant. Negative signs include Murphy's sign.  Genitourinary:    Vagina: Vaginal discharge present. No bleeding.  Cervix: Cervical motion tenderness, discharge and friability present.     Adnexa: Right adnexa normal.       Left: Tenderness present.      Comments: Purulent vaginal discharge present, + CMT, Left adnexal TTP Musculoskeletal:        General: No swelling.     Cervical back: Neck supple.  Skin:    General: Skin is warm and dry.     Capillary Refill: Capillary refill takes less than 2 seconds.  Neurological:     Mental Status: She is alert.  Psychiatric:        Mood and  Affect: Mood normal.     (all labs ordered are listed, but only abnormal results are displayed) Labs Reviewed  BASIC METABOLIC PANEL WITH GFR - Abnormal; Notable for the following components:      Result Value   Potassium 3.4 (*)    Glucose, Bld 128 (*)    All other components within normal limits  URINALYSIS, ROUTINE W REFLEX MICROSCOPIC - Abnormal; Notable for the following components:   Hgb urine dipstick MODERATE (*)    Protein, ur 100 (*)    Leukocytes,Ua SMALL (*)    All other components within normal limits  D-DIMER, QUANTITATIVE - Abnormal; Notable for the following components:   D-Dimer, Quant 1.87 (*)    All other components within normal limits  HEPATIC FUNCTION PANEL - Abnormal; Notable for the following components:   Indirect Bilirubin 0.2 (*)    All other components within normal limits  WET PREP, GENITAL  RESP PANEL BY RT-PCR (RSV, FLU A&B, COVID)  RVPGX2  CULTURE, BLOOD (ROUTINE X 2)  CULTURE, BLOOD (ROUTINE X 2)  CBC  PREGNANCY, URINE  LIPASE, BLOOD  URINALYSIS, MICROSCOPIC (REFLEX)  HIV ANTIBODY (ROUTINE TESTING W REFLEX)  TSH  T4, FREE  SYPHILIS: RPR W/REFLEX TO RPR TITER AND TREPONEMAL ANTIBODIES, TRADITIONAL SCREENING AND DIAGNOSIS ALGORITHM  GC/CHLAMYDIA PROBE AMP ([place]) NOT AT [facility]  TROPONIN T, HIGH SENSITIVITY  TROPONIN T, HIGH SENSITIVITY    EKG: EKG Interpretation Date/Time:  Monday August 15 2024 08:49:10 EST Ventricular Rate:  103 PR Interval:  133 QRS Duration:  101 QT Interval:  318 QTC Calculation: 417 R Axis:   189  Text Interpretation: Sinus tachycardia Probable right ventricular hypertrophy Confirmed by Jerrol Agent ([id number]) on 08/15/2024 12:13:26 PM  Radiology: CT Angio Chest PE W and/or Wo Contrast Result Date: 08/15/2024 CLINICAL DATA:  Right-sided pleuritic chest pain EXAM: CT ANGIOGRAPHY CHEST WITH CONTRAST TECHNIQUE: Multidetector CT imaging of the chest was performed using the standard protocol during bolus  administration of intravenous contrast. Multiplanar CT image reconstructions and MIPs were obtained to evaluate the vascular anatomy. RADIATION DOSE REDUCTION: This exam was performed according to the departmental dose-optimization program which includes automated exposure control, adjustment of the mA and/or kV according to patient size and/or use of iterative reconstruction technique. CONTRAST:  OMNIPAQUE  IOHEXOL  350 MG/ML SOLN COMPARISON:  Same day chest radiograph FINDINGS: Cardiovascular: The study is high quality for the evaluation of pulmonary embolism. There are no filling defects in the central, lobar, segmental or subsegmental pulmonary artery branches to suggest acute pulmonary embolism. Great vessels are normal in course and caliber. Normal heart size. No significant pericardial fluid/thickening. Mediastinum/Nodes: Imaged thyroid  gland without nodules meeting criteria for imaging follow-up by size. Normal esophagus. No pathologically enlarged axillary, supraclavicular, mediastinal, or hilar lymph nodes. Lungs/Pleura: The central airways are patent. No focal consolidation. No pneumothorax. No pleural effusion. Upper abdomen: Please see separately  dictated CT abdomen and pelvis report for detailed findings. Musculoskeletal: No acute or abnormal lytic or blastic osseous lesions. Chronic concavity of the anterior right chest wall. Review of the MIP images confirms the above findings. IMPRESSION: No acute pulmonary embolism. Electronically Signed   By: Limin  Xu M.D.   On: 08/15/2024 14:57   CT ABDOMEN PELVIS W CONTRAST Result Date: 08/15/2024 EXAM: CT ABDOMEN AND PELVIS WITH CONTRAST 08/15/2024 02:15:05 PM TECHNIQUE: CT of the abdomen and pelvis was performed with the administration of 100 mL of iohexol  (OMNIPAQUE ) 350 MG/ML injection. Multiplanar reformatted images are provided for review. Automated exposure control, iterative reconstruction, and/or weight-based adjustment of the mA/kV was utilized  to reduce the radiation dose to as low as reasonably achievable. COMPARISON: Ultrasound pelvis 1:5:26 CLINICAL HISTORY: LLQ abdominal pain. FINDINGS: LOWER CHEST: Please see separately dictated CT angiography chest 08/15/2024. LIVER: The liver is enlarged, measuring up to 20 cm. GALLBLADDER AND BILE DUCTS: Calcified stone within the gallbladder lumen. No gallbladder wall thickening. No pericholecystic fluid. No biliary ductal dilatation. SPLEEN: No acute abnormality. PANCREAS: No acute abnormality. ADRENAL GLANDS: No acute abnormality. KIDNEYS, URETERS AND BLADDER: No stones in the kidneys or ureters. No hydronephrosis. No perinephric or periureteral stranding. Urinary bladder is unremarkable. GI AND BOWEL: Stomach demonstrates no acute abnormality. The appendix measures at the upper limits of normal (7 mm). No associated periappendiceal fat stranding or inflammatory changes within the right lower quadrant. There is no bowel obstruction. PERITONEUM AND RETROPERITONEUM: No ascites. No free air. VASCULATURE: Aorta is normal in caliber. LYMPH NODES: Asymmetrically prominent but non-enlarged 1.1 cm left inguinal lymph node (2.76). No lymphadenopathy. REPRODUCTIVE ORGANS: A T-shaped intrauterine device is noted within the uterus in appropriate position. The uterus is otherwise unremarkable. No adnexal mass. BONES AND SOFT TISSUES: No acute osseous abnormality. No focal soft tissue abnormality. IMPRESSION: 1. Appendix measures at the upper limits of normal (7 mm) without periappendiceal fat stranding or inflammatory change. 2. Cholelithiasis without CT evidence of acute cholecystitis. 3. Hepatomegaly. Electronically signed by: Morgane Naveau MD 08/15/2024 02:52 PM EST RP Workstation: [id number]   US  PELVIC COMPLETE W TRANSVAGINAL AND TORSION R/O Result Date: 08/15/2024 CLINICAL DATA:  Pelvic pain. EXAM: TRANSABDOMINAL AND TRANSVAGINAL ULTRASOUND OF PELVIS DOPPLER ULTRASOUND OF OVARIES TECHNIQUE: Both transabdominal and  transvaginal ultrasound examinations of the pelvis were performed. Transabdominal technique was performed for global imaging of the pelvis including uterus, ovaries, adnexal regions, and pelvic cul-de-sac. It was necessary to proceed with endovaginal exam following the transabdominal exam to visualize the uterus, endometrium, ovaries and adnexal regions. Color and duplex Doppler ultrasound was utilized to evaluate blood flow to the ovaries. COMPARISON:  None Available. FINDINGS: Uterus Measurements: 9.3 x 3.9 x 5.2 cm = volume: 97.5 mL. No fibroids or other mass visualized. Endometrium Thickness: 7 mm, within normal limits. Intrauterine contraceptive device is appropriately positioned. Right ovary Measurements: 3.1 x 1.6 x 2.3 cm = volume:  6.0 mL. Normal appearance/no adnexal mass. Doppler: There is normal vascularity on color doppler examination. Spectral doppler arterial and venous waveforms are normal. Left ovary Measurements: 2.2 x 1.6 x 1.5 cm = volume: 2.8 mL. Normal appearance/no adnexal mass. Doppler: There is normal vascularity on color doppler examination. Spectral doppler arterial and venous waveforms are normal. Other findings No abnormal free fluid. IMPRESSION: 1. Normal exam. 2. Intrauterine contraceptive device in place. Electronically Signed   By: Newell Eke M.D.   On: 08/15/2024 14:29   DG Chest 2 View Result Date: 08/15/2024 CLINICAL DATA:  Right-sided  pleuritic chest pain.  Epigastric pain. EXAM: CHEST - 2 VIEW COMPARISON:  09/20/2019 FINDINGS: The heart size and mediastinal contours are within normal limits. Both lungs are clear. No evidence of pneumothorax or pleural effusion. The visualized skeletal structures are unremarkable. IMPRESSION: No active cardiopulmonary disease. Electronically Signed   By: Norleen DELENA Kil M.D.   On: 08/15/2024 09:45     Procedures   Medications Ordered in the ED  sodium chloride  0.9 % bolus 1,000 mL (0 mLs Intravenous Stopped 08/15/24 1339)  cefTRIAXone   (ROCEPHIN ) injection 500 mg (500 mg Intramuscular Given 08/15/24 1448)  doxycycline  (VIBRA -TABS) tablet 100 mg (100 mg Oral Given 08/15/24 1445)  fentaNYL  (SUBLIMAZE ) injection 50 mcg (50 mcg Intravenous Given 08/15/24 1445)  lidocaine  (PF) (XYLOCAINE ) 1 % injection 1 mL (1 mL Other Given 08/15/24 1448)  iohexol  (OMNIPAQUE ) 350 MG/ML injection 100 mL (100 mLs Intravenous Contrast Given 08/15/24 1405)                                    Medical Decision Making Amount and/or Complexity of Data Reviewed Labs: ordered. Radiology: ordered.  Risk Prescription drug management.    34 year old female with medical history significant for asthma, anxiety presenting to the emergency department with multiple complaints.  The patient states that she has been having chest pain, pleuritic in nature, worse with taking a deep breath,, right sided in nature.  Pain radiates to her right shoulder and neck.  She feels generalized fatigue and weakness.  She experienced a fever last night greater than 101.  Her symptoms started this past Saturday.  She also endorses some epigastric discomfort.  Additionally, she endorses burning with urination, has had some vaginal discharge which is abnormal, endorses crampy discomfort in her pelvis, predominantly on the left.  Request to be tested for STIs stating I am recently married and I would like to be evaluated for STIs.  On arrival, the patient was afebrile, tachycardic heart rate 110, not tachypneic, BP initially soft 97/66, improved with fluids, saturating 100% on room air.  Patient with bilateral lower abdominal tenderness, initially on exam was in the left lower quadrant, did have some periumbilical and right lower quadrant tenderness as well on repeat assessment.  Patient without tenderness in the right upper quadrant.  Negative Murphy's sign.  Initial Assessment:   With the patient's presentation of abdominal pain, most likely diagnosis is UTI, PID. Other diagnoses were  considered including (but not limited to) gastroenteritis, colitis, small bowel obstruction, appendicitis, cholecystitis, pancreatitis, nephrolithiasis, pyelonephritis, diverticulitis ruptured ectopic pregnancy, PID, ovarian torsion. These are considered less likely due to history of present illness and physical exam findings.   This is most consistent with an acute complicated illness   Initial Plan:  CBC/CMP to evaluate for underlying infectious/metabolic etiology for patient's abdominal pain  Lipase to evaluate for pancreatitis  EKG to evaluate for cardiac source of pain  CTAB/Pelvis with contrast to evaluate for structural/surgical etiology of patients' severe abdominal pain.  Pelvic US  to evaluate for torsion Urinalysis and repeat physical assessment to evaluate for UTI/Pyelonpehritis  Empiric management of symptoms with escalating pain control and antiemetics as needed.  ABX to cover empirically for STIs  Initial Study Results:   Laboratory  All laboratory results reviewed without evidence of clinically relevant pathology.   Exceptions include: BMP generally unremarkable, mildly low potassium at 3.4, CBC unremarkable, urinalysis unconvincing for UTI, moderate hemoglobin, negative nitrites, small leukocytes, no  bacteria seen.  I D-dimer was elevated at 1.87.  Wet prep was negative, COVID flu and RSV PCR testing was negative, cardiac troponin was negative x 2.   EKG EKG was reviewed independently. Rate, rhythm, axis, intervals all examined and without medically relevant abnormality. ST segments without concerns for elevations.  Tachycardia, ventricular rate 103 noted.  Radiology  CT Angio Chest PE W and/or Wo Contrast Result Date: 08/15/2024 CLINICAL DATA:  Right-sided pleuritic chest pain EXAM: CT ANGIOGRAPHY CHEST WITH CONTRAST TECHNIQUE: Multidetector CT imaging of the chest was performed using the standard protocol during bolus administration of intravenous contrast. Multiplanar CT  image reconstructions and MIPs were obtained to evaluate the vascular anatomy. RADIATION DOSE REDUCTION: This exam was performed according to the departmental dose-optimization program which includes automated exposure control, adjustment of the mA and/or kV according to patient size and/or use of iterative reconstruction technique. CONTRAST:  OMNIPAQUE  IOHEXOL  350 MG/ML SOLN COMPARISON:  Same day chest radiograph FINDINGS: Cardiovascular: The study is high quality for the evaluation of pulmonary embolism. There are no filling defects in the central, lobar, segmental or subsegmental pulmonary artery branches to suggest acute pulmonary embolism. Great vessels are normal in course and caliber. Normal heart size. No significant pericardial fluid/thickening. Mediastinum/Nodes: Imaged thyroid  gland without nodules meeting criteria for imaging follow-up by size. Normal esophagus. No pathologically enlarged axillary, supraclavicular, mediastinal, or hilar lymph nodes. Lungs/Pleura: The central airways are patent. No focal consolidation. No pneumothorax. No pleural effusion. Upper abdomen: Please see separately dictated CT abdomen and pelvis report for detailed findings. Musculoskeletal: No acute or abnormal lytic or blastic osseous lesions. Chronic concavity of the anterior right chest wall. Review of the MIP images confirms the above findings. IMPRESSION: No acute pulmonary embolism. Electronically Signed   By: Limin  Xu M.D.   On: 08/15/2024 14:57   CT ABDOMEN PELVIS W CONTRAST Result Date: 08/15/2024 EXAM: CT ABDOMEN AND PELVIS WITH CONTRAST 08/15/2024 02:15:05 PM TECHNIQUE: CT of the abdomen and pelvis was performed with the administration of 100 mL of iohexol  (OMNIPAQUE ) 350 MG/ML injection. Multiplanar reformatted images are provided for review. Automated exposure control, iterative reconstruction, and/or weight-based adjustment of the mA/kV was utilized to reduce the radiation dose to as low as reasonably  achievable. COMPARISON: Ultrasound pelvis 1:5:26 CLINICAL HISTORY: LLQ abdominal pain. FINDINGS: LOWER CHEST: Please see separately dictated CT angiography chest 08/15/2024. LIVER: The liver is enlarged, measuring up to 20 cm. GALLBLADDER AND BILE DUCTS: Calcified stone within the gallbladder lumen. No gallbladder wall thickening. No pericholecystic fluid. No biliary ductal dilatation. SPLEEN: No acute abnormality. PANCREAS: No acute abnormality. ADRENAL GLANDS: No acute abnormality. KIDNEYS, URETERS AND BLADDER: No stones in the kidneys or ureters. No hydronephrosis. No perinephric or periureteral stranding. Urinary bladder is unremarkable. GI AND BOWEL: Stomach demonstrates no acute abnormality. The appendix measures at the upper limits of normal (7 mm). No associated periappendiceal fat stranding or inflammatory changes within the right lower quadrant. There is no bowel obstruction. PERITONEUM AND RETROPERITONEUM: No ascites. No free air. VASCULATURE: Aorta is normal in caliber. LYMPH NODES: Asymmetrically prominent but non-enlarged 1.1 cm left inguinal lymph node (2.76). No lymphadenopathy. REPRODUCTIVE ORGANS: A T-shaped intrauterine device is noted within the uterus in appropriate position. The uterus is otherwise unremarkable. No adnexal mass. BONES AND SOFT TISSUES: No acute osseous abnormality. No focal soft tissue abnormality. IMPRESSION: 1. Appendix measures at the upper limits of normal (7 mm) without periappendiceal fat stranding or inflammatory change. 2. Cholelithiasis without CT evidence of acute cholecystitis.  3. Hepatomegaly. Electronically signed by: Morgane Naveau MD 08/15/2024 02:52 PM EST RP Workstation: [id number]   US  PELVIC COMPLETE W TRANSVAGINAL AND TORSION R/O Result Date: 08/15/2024 CLINICAL DATA:  Pelvic pain. EXAM: TRANSABDOMINAL AND TRANSVAGINAL ULTRASOUND OF PELVIS DOPPLER ULTRASOUND OF OVARIES TECHNIQUE: Both transabdominal and transvaginal ultrasound examinations of the pelvis  were performed. Transabdominal technique was performed for global imaging of the pelvis including uterus, ovaries, adnexal regions, and pelvic cul-de-sac. It was necessary to proceed with endovaginal exam following the transabdominal exam to visualize the uterus, endometrium, ovaries and adnexal regions. Color and duplex Doppler ultrasound was utilized to evaluate blood flow to the ovaries. COMPARISON:  None Available. FINDINGS: Uterus Measurements: 9.3 x 3.9 x 5.2 cm = volume: 97.5 mL. No fibroids or other mass visualized. Endometrium Thickness: 7 mm, within normal limits. Intrauterine contraceptive device is appropriately positioned. Right ovary Measurements: 3.1 x 1.6 x 2.3 cm = volume:  6.0 mL. Normal appearance/no adnexal mass. Doppler: There is normal vascularity on color doppler examination. Spectral doppler arterial and venous waveforms are normal. Left ovary Measurements: 2.2 x 1.6 x 1.5 cm = volume: 2.8 mL. Normal appearance/no adnexal mass. Doppler: There is normal vascularity on color doppler examination. Spectral doppler arterial and venous waveforms are normal. Other findings No abnormal free fluid. IMPRESSION: 1. Normal exam. 2. Intrauterine contraceptive device in place. Electronically Signed   By: Newell Eke M.D.   On: 08/15/2024 14:29   DG Chest 2 View Result Date: 08/15/2024 CLINICAL DATA:  Right-sided pleuritic chest pain.  Epigastric pain. EXAM: CHEST - 2 VIEW COMPARISON:  09/20/2019 FINDINGS: The heart size and mediastinal contours are within normal limits. Both lungs are clear. No evidence of pneumothorax or pleural effusion. The visualized skeletal structures are unremarkable. IMPRESSION: No active cardiopulmonary disease. Electronically Signed   By: Norleen DELENA Kil M.D.   On: 08/15/2024 09:45    .   Final Reassessment and Plan:   Considered PID and the patient was treated empirically for this.  Considered PE however PE study ultimately resulted negative.  She was found to have  gallstones seen on CT of her abdomen pelvis.  Considered symptomatic cholelithiasis in the setting of the patient's symptoms.  Hepatic function panel was pending but ultimately resulted negative.  Discussed with general surgery, recommended right upper quadrant ultrasound and evaluation of LFTs to further evaluate.  Will discharge empirically for treatment for PID in the setting of the patient's symptoms.  Appendix also dilated to the upper limit of normal on CT.  General surgery not concerned for appendicitis at this time, spoke with PA Edmundo.  Plan at time of signout to reassess the patient, follow-up on right upper quadrant ultrasound and LFTs.  Signout given to Dr. Doretha at [phone].      Final diagnoses:  None    ED Discharge Orders     None          Jerrol Agent, MD 08/15/24 1620  "

## 2024-08-15 NOTE — Telephone Encounter (Signed)
 I called patient and left a message to call the office and schedule an appointment for ED follow up.

## 2024-08-15 NOTE — Telephone Encounter (Signed)
 FYI Only or Action Required?: FYI only for provider: ED advised.  Patient was last seen in primary care on 06/06/2024 by Nedra Tinnie LABOR, NP.  Called Nurse Triage reporting Shoulder Pain.  Symptoms began several days ago.  Interventions attempted: OTC medications: advil, ibuprofen.  Symptoms are: unchanged.  Triage Disposition: Go to ED Now (Notify PCP)  Patient/caregiver understands and will follow disposition?: Yes   Copied from CRM [id number]. Topic: Clinical - Red Word Triage >> Aug 15, 2024  7:59 AM Deleta RAMAN wrote: Red Word that prompted transfer to Nurse Triage: burns when urinating, fever 103.1, cold sweats, shoulder pain that goes into jaw line (rightside), weakness and dizziness. Feels like someone has punched her in the stomach. Very fatigue.. A few symptoms are from this weekend Reason for Disposition  [1] Age > 40 AND [2] no obvious cause AND [3] pain even when not moving the arm  (Exception: Pain is clearly made worse by moving arm or bending neck.)  Answer Assessment - Initial Assessment Questions Advised ED now. Advised 911 if symptoms worsen. Patient verbalized understanding.  1. ONSET: When did the pain start?     Saturday;  2. LOCATION: Where is the pain located?     Right shoulder to right shoulder, arm; movement and rest does not make better 3. PAIN: How bad is the pain? (Scale 1-10; or mild, moderate, severe)     7/10; Advil, ibuprofen 4. WORK OR EXERCISE: Has there been any recent work or exercise that involved this part of the body?     no 5. CAUSE: What do you think is causing the shoulder pain?     no 6. OTHER SYMPTOMS: Do you have any other symptoms? (e.g., neck pain, swelling, rash, fever, numbness, weakness)     Fever did not take today, neck pain, arm pain,weakness burns with urination, sweating,  denies numbness, chest pain, diff breathing  Protocols used: Shoulder Pain-A-AH

## 2024-08-15 NOTE — ED Triage Notes (Signed)
 Patient reports chest pain worse on taking a deep breath on the right side with radiation to shoulder, neck, and jaw. Also feels weak. This started on Saturday. Reports epigastric pain. Reports burning with urination.

## 2024-08-16 ENCOUNTER — Emergency Department (HOSPITAL_COMMUNITY)

## 2024-08-16 ENCOUNTER — Other Ambulatory Visit: Payer: Self-pay

## 2024-08-16 ENCOUNTER — Inpatient Hospital Stay (HOSPITAL_COMMUNITY)
Admission: EM | Admit: 2024-08-16 | Discharge: 2024-08-19 | DRG: 690 | Disposition: A | Discharge status: Home / Self Care | Attending: Internal Medicine | Admitting: Internal Medicine

## 2024-08-16 ENCOUNTER — Encounter (HOSPITAL_COMMUNITY): Payer: Self-pay | Admitting: Family Medicine

## 2024-08-16 DIAGNOSIS — N10 Acute tubulo-interstitial nephritis: Secondary | ICD-10-CM | POA: Diagnosis not present

## 2024-08-16 DIAGNOSIS — N12 Tubulo-interstitial nephritis, not specified as acute or chronic: Principal | ICD-10-CM

## 2024-08-16 DIAGNOSIS — J4520 Mild intermittent asthma, uncomplicated: Secondary | ICD-10-CM | POA: Diagnosis present

## 2024-08-16 DIAGNOSIS — F419 Anxiety disorder, unspecified: Secondary | ICD-10-CM | POA: Diagnosis present

## 2024-08-16 DIAGNOSIS — R109 Unspecified abdominal pain: Secondary | ICD-10-CM | POA: Diagnosis present

## 2024-08-16 DIAGNOSIS — D529 Folate deficiency anemia, unspecified: Secondary | ICD-10-CM | POA: Diagnosis present

## 2024-08-16 DIAGNOSIS — Z8249 Family history of ischemic heart disease and other diseases of the circulatory system: Secondary | ICD-10-CM

## 2024-08-16 DIAGNOSIS — Z98891 History of uterine scar from previous surgery: Secondary | ICD-10-CM

## 2024-08-16 DIAGNOSIS — Z1152 Encounter for screening for COVID-19: Secondary | ICD-10-CM

## 2024-08-16 DIAGNOSIS — K649 Unspecified hemorrhoids: Secondary | ICD-10-CM | POA: Diagnosis present

## 2024-08-16 DIAGNOSIS — K8020 Calculus of gallbladder without cholecystitis without obstruction: Secondary | ICD-10-CM | POA: Diagnosis present

## 2024-08-16 DIAGNOSIS — D696 Thrombocytopenia, unspecified: Secondary | ICD-10-CM | POA: Diagnosis present

## 2024-08-16 DIAGNOSIS — N739 Female pelvic inflammatory disease, unspecified: Secondary | ICD-10-CM | POA: Diagnosis present

## 2024-08-16 DIAGNOSIS — Z79899 Other long term (current) drug therapy: Secondary | ICD-10-CM

## 2024-08-16 DIAGNOSIS — D509 Iron deficiency anemia, unspecified: Secondary | ICD-10-CM | POA: Diagnosis present

## 2024-08-16 DIAGNOSIS — Z793 Long term (current) use of hormonal contraceptives: Secondary | ICD-10-CM

## 2024-08-16 DIAGNOSIS — Z975 Presence of (intrauterine) contraceptive device: Secondary | ICD-10-CM

## 2024-08-16 DIAGNOSIS — Z833 Family history of diabetes mellitus: Secondary | ICD-10-CM

## 2024-08-16 DIAGNOSIS — Z825 Family history of asthma and other chronic lower respiratory diseases: Secondary | ICD-10-CM

## 2024-08-16 DIAGNOSIS — E876 Hypokalemia: Secondary | ICD-10-CM | POA: Diagnosis present

## 2024-08-16 LAB — URINALYSIS, ROUTINE W REFLEX MICROSCOPIC
Bilirubin Urine: NEGATIVE
Glucose, UA: NEGATIVE mg/dL
Ketones, ur: 20 mg/dL — AB
Nitrite: NEGATIVE
Protein, ur: 100 mg/dL — AB
RBC / HPF: >50 RBC/hpf (ref 0–5)
Specific Gravity, Urine: 1.029 (ref 1.005–1.030)
pH: 5 (ref 5.0–8.0)

## 2024-08-16 LAB — CBC
HCT: 40.2 % (ref 36.0–46.0)
Hemoglobin: 13.9 g/dL (ref 12.0–15.0)
MCH: 32 pg (ref 26.0–34.0)
MCHC: 34.6 g/dL (ref 30.0–36.0)
MCV: 92.4 fL (ref 80.0–100.0)
Platelets: 181 K/uL (ref 150–400)
RBC: 4.35 MIL/uL (ref 3.87–5.11)
RDW: 11.7 % (ref 11.5–15.5)
WBC: 5.7 K/uL (ref 4.0–10.5)
nRBC: 0 % (ref 0.0–0.2)

## 2024-08-16 LAB — COMPREHENSIVE METABOLIC PANEL WITH GFR
ALT: 37 U/L (ref 0–44)
AST: 30 U/L (ref 15–41)
Albumin: 4 g/dL (ref 3.5–5.0)
Alkaline Phosphatase: 46 U/L (ref 38–126)
Anion gap: 11 (ref 5–15)
BUN: 8 mg/dL (ref 6–20)
CO2: 25 mmol/L (ref 22–32)
Calcium: 8.9 mg/dL (ref 8.9–10.3)
Chloride: 103 mmol/L (ref 98–111)
Creatinine, Ser: 0.77 mg/dL (ref 0.44–1.00)
GFR, Estimated: >60 mL/min
Glucose, Bld: 79 mg/dL (ref 70–99)
Potassium: 3.7 mmol/L (ref 3.5–5.1)
Sodium: 139 mmol/L (ref 135–145)
Total Bilirubin: 0.3 mg/dL (ref 0.0–1.2)
Total Protein: 7.2 g/dL (ref 6.5–8.1)

## 2024-08-16 LAB — GC/CHLAMYDIA PROBE AMP (~~LOC~~) NOT AT ARMC
Chlamydia: NEGATIVE
Comment: NEGATIVE
Comment: NORMAL
Neisseria Gonorrhea: NEGATIVE

## 2024-08-16 LAB — HCG, SERUM, QUALITATIVE: Preg, Serum: NEGATIVE

## 2024-08-16 LAB — I-STAT CG4 LACTIC ACID, ED: Lactic Acid, Venous: 1 mmol/L (ref 0.5–1.9)

## 2024-08-16 LAB — LIPASE, BLOOD: Lipase: 26 U/L (ref 11–51)

## 2024-08-16 LAB — SYPHILIS: RPR W/REFLEX TO RPR TITER AND TREPONEMAL ANTIBODIES, TRADITIONAL SCREENING AND DIAGNOSIS ALGORITHM: RPR Ser Ql: NONREACTIVE

## 2024-08-16 MED ORDER — OXYCODONE HCL 5 MG PO TABS
5.0000 mg | ORAL_TABLET | ORAL | Status: DC | PRN
  Administered 2024-08-17 (×2): 5 mg via ORAL
  Filled 2024-08-16 (×2): qty 1

## 2024-08-16 MED ORDER — SODIUM CHLORIDE 0.9 % IV SOLN
1.0000 g | Freq: Once | INTRAVENOUS | Status: AC
  Administered 2024-08-16: 1 g via INTRAVENOUS
  Filled 2024-08-16: qty 10

## 2024-08-16 MED ORDER — ONDANSETRON HCL 4 MG PO TABS: 4.0000 mg | ORAL_TABLET | Freq: Four times a day (QID) | ORAL | Status: DC | PRN

## 2024-08-16 MED ORDER — KETOROLAC TROMETHAMINE 30 MG/ML IJ SOLN
30.0000 mg | Freq: Four times a day (QID) | INTRAMUSCULAR | Status: DC | PRN
  Administered 2024-08-17 (×2): 30 mg via INTRAVENOUS
  Filled 2024-08-16 (×2): qty 1

## 2024-08-16 MED ORDER — SODIUM CHLORIDE 0.9 % IV BOLUS
1000.0000 mL | Freq: Once | INTRAVENOUS | Status: AC
  Administered 2024-08-16: 1000 mL via INTRAVENOUS

## 2024-08-16 MED ORDER — HYDROMORPHONE HCL 1 MG/ML IJ SOLN
1.0000 mg | Freq: Once | INTRAMUSCULAR | Status: AC
  Administered 2024-08-16: 1 mg via INTRAVENOUS
  Filled 2024-08-16: qty 1

## 2024-08-16 MED ORDER — DOXYCYCLINE HYCLATE 100 MG PO TABS
100.0000 mg | ORAL_TABLET | Freq: Two times a day (BID) | ORAL | Status: DC
  Administered 2024-08-16 – 2024-08-19 (×6): 100 mg via ORAL
  Filled 2024-08-16 (×6): qty 1

## 2024-08-16 MED ORDER — HYDROMORPHONE HCL 1 MG/ML IJ SOLN: 1.0000 mg | INTRAMUSCULAR | Status: DC | PRN

## 2024-08-16 MED ORDER — ACETAMINOPHEN 650 MG RE SUPP: 650.0000 mg | Freq: Four times a day (QID) | RECTAL | Status: DC | PRN

## 2024-08-16 MED ORDER — SODIUM CHLORIDE 0.9 % IV SOLN
2.0000 g | INTRAVENOUS | Status: DC
  Administered 2024-08-17 – 2024-08-18 (×2): 2 g via INTRAVENOUS
  Filled 2024-08-16 (×2): qty 20

## 2024-08-16 MED ORDER — ONDANSETRON HCL 4 MG/2ML IJ SOLN
4.0000 mg | Freq: Once | INTRAMUSCULAR | Status: AC
  Administered 2024-08-16: 4 mg via INTRAVENOUS
  Filled 2024-08-16: qty 2

## 2024-08-16 MED ORDER — POLYETHYLENE GLYCOL 3350 17 G PO PACK: 17.0000 g | PACK | Freq: Every day | ORAL | Status: DC | PRN

## 2024-08-16 MED ORDER — MORPHINE SULFATE (PF) 4 MG/ML IV SOLN
4.0000 mg | Freq: Once | INTRAVENOUS | Status: AC
  Administered 2024-08-16: 4 mg via INTRAVENOUS
  Filled 2024-08-16: qty 1

## 2024-08-16 MED ORDER — KETOROLAC TROMETHAMINE 30 MG/ML IJ SOLN
30.0000 mg | Freq: Once | INTRAMUSCULAR | Status: AC
  Administered 2024-08-16: 30 mg via INTRAVENOUS
  Filled 2024-08-16: qty 1

## 2024-08-16 MED ORDER — METRONIDAZOLE 500 MG PO TABS
500.0000 mg | ORAL_TABLET | Freq: Two times a day (BID) | ORAL | Status: DC
  Administered 2024-08-16 – 2024-08-19 (×6): 500 mg via ORAL
  Filled 2024-08-16 (×6): qty 1

## 2024-08-16 MED ORDER — MORPHINE SULFATE (PF) 4 MG/ML IV SOLN: 4.0000 mg | Freq: Once | INTRAVENOUS | Status: DC

## 2024-08-16 MED ORDER — FAMOTIDINE 20 MG PO TABS
20.0000 mg | ORAL_TABLET | Freq: Once | ORAL | Status: AC
  Administered 2024-08-16: 20 mg via ORAL
  Filled 2024-08-16: qty 1

## 2024-08-16 MED ORDER — MORPHINE SULFATE (PF) 4 MG/ML IV SOLN
4.0000 mg | INTRAVENOUS | Status: DC | PRN
  Administered 2024-08-16: 4 mg via INTRAVENOUS
  Filled 2024-08-16: qty 1

## 2024-08-16 MED ORDER — ONDANSETRON HCL 4 MG/2ML IJ SOLN: 4.0000 mg | Freq: Four times a day (QID) | INTRAMUSCULAR | Status: DC | PRN

## 2024-08-16 MED ORDER — ALBUTEROL SULFATE (2.5 MG/3ML) 0.083% IN NEBU: 3.0000 mL | INHALATION_SOLUTION | RESPIRATORY_TRACT | Status: DC | PRN

## 2024-08-16 MED ORDER — ACETAMINOPHEN 325 MG PO TABS
650.0000 mg | ORAL_TABLET | Freq: Four times a day (QID) | ORAL | Status: DC | PRN
  Administered 2024-08-18: 650 mg via ORAL
  Filled 2024-08-16: qty 2

## 2024-08-16 MED ORDER — ENOXAPARIN SODIUM 40 MG/0.4ML IJ SOSY
40.0000 mg | PREFILLED_SYRINGE | INTRAMUSCULAR | Status: DC
  Administered 2024-08-17: 40 mg via SUBCUTANEOUS
  Filled 2024-08-16: qty 0.4

## 2024-08-16 MED ORDER — HYDROMORPHONE HCL 1 MG/ML IJ SOLN
0.5000 mg | INTRAMUSCULAR | Status: DC | PRN
  Administered 2024-08-17 (×5): 1 mg via INTRAVENOUS
  Filled 2024-08-16 (×6): qty 1

## 2024-08-16 NOTE — H&P (Signed)
 " History and Physical    Erin Pratt FMW:[id number] DOB: 06-01-1991 DOA: 08/16/2024  PCP: Nedra Tinnie LABOR, NP   Patient coming from: Home   Chief Complaint: abdominal pain, dysuria, fatigue   HPI: Erin Pratt is a 34 y.o. female with medical history significant for asthma and anxiety who presents with fatigue, abdominal pain, and dysuria.  Patient developed right-sided flank and abdominal pain with nausea, fever, dysuria, and fatigue on 08/13/2024.  She was seen in the ED on 08/15/2024 where she had extensive workup including negative CTA chest, cholelithiasis without evidence for acute cholecystitis on ultrasound, normal pelvic ultrasound, and negative STI testing.  She was started on empiric treatment for PID and returned home but has continued to experience severe pain that has become unbearable.  Patient describes ongoing pain involving the lower abdomen/suprapubic region, worse on the right, as well as additional pain near the right shoulder blade.  This is associated with dysuria.  She also reports having a fever on 08/14/2024.  ED Course: Upon arrival to the ED, patient is found to be afebrile and saturating well on room air with normal RR, normal HR, and stable BP.  CMP, lipase, CBC, and lactic acid are normal.  UA notable for bacteriuria, pyuria, and microscopic hematuria.  There are no acute findings on CT of the abdomen and pelvis and ultrasound reveals a single gallstone without evidence for acute cholecystitis.  Urine was cultured in the ED and the patient was given a liter of NS, Pepcid , Zofran  x 2, Dilaudid , morphine , Toradol , and Rocephin .  Review of Systems:  All other systems reviewed and apart from HPI, are negative.  Past Medical History:  Diagnosis Date   Anxiety    situational   Asthma    Migraine     Past Surgical History:  Procedure Laterality Date   CESAREAN SECTION  2017   ELBOW SURGERY Right 2008   SHOULDER SURGERY Right 2009   TONSILLECTOMY       Social History:   reports that she has never smoked. She has never used smokeless tobacco. She reports that she does not drink alcohol and does not use drugs.  Allergies[1]  Family History  Problem Relation Age of Onset   Diabetes Mother    Asthma Mother    Hypertension Mother    Arthritis Mother    Asthma Father    Asthma Sister    Asthma Brother    Arthritis Maternal Grandmother        RA   Hypertension Maternal Grandmother    Cancer - Lung Maternal Grandfather      Prior to Admission medications  Medication Sig Start Date End Date Taking? Authorizing Provider  albuterol  (PROVENTIL  HFA;VENTOLIN  HFA) 108 (90 Base) MCG/ACT inhaler INHALE 2 PUFFS INTO THE LUNGS EVERY 4 (FOUR) HOURS AS NEEDED FOR WHEEZING OR SHORTNESS OF BREATH. 11/08/18   Mercer Clotilda SAUNDERS, MD  azelastine  (ASTELIN ) 0.1 % nasal spray Place 1 spray into both nostrils 2 (two) times daily. Use in each nostril as directed Patient taking differently: Place 1 spray into both nostrils as needed. Use in each nostril as directed 07/20/22   Vivienne Delon HERO, PA-C  doxycycline  (VIBRAMYCIN ) 100 MG capsule Take 1 capsule (100 mg total) by mouth 2 (two) times daily. 08/15/24   Doretha Folks, MD  hydrOXYzine  (VISTARIL ) 25 MG capsule Take 1 capsule (25 mg total) by mouth every 8 (eight) hours as needed. 10/16/23   McElwee, Lauren A, NP  ibuprofen (ADVIL) 800 MG tablet Take  1 tablet by mouth as needed.    [provider]  JUNEL FE 1/20 1-20 MG-MCG tablet Take 1 tablet by mouth daily. 06/07/24   [provider]  levonorgestrel (MIRENA, 52 MG,) 20 MCG/DAY IUD 1 each by Intrauterine route once. 08/12/16   [provider]    Physical Exam: Vitals:   08/16/24 0803 08/16/24 0955 08/16/24 1300 08/16/24 1756  BP:  93/61 117/85 103/73  Pulse:  75 88 65  Resp:  16 17 14   Temp:  (!) 97.4 F (36.3 C) 97.7 F (36.5 C) 97.8 F (36.6 C)  TempSrc:    Oral  SpO2:  100% 100% 100%  Weight: 72.6 kg     Height:  5' 7 (1.702 m)        Constitutional: NAD, no pallor or diaphoresis   Eyes: PERTLA, lids and conjunctivae normal ENMT: Mucous membranes are moist. Posterior pharynx clear of any exudate or lesions.   Neck: supple, no masses  Respiratory: no wheezing, no crackles. No accessory muscle use.  Cardiovascular: S1 & S2 heard, regular rate and rhythm. No extremity edema.  Abdomen: Soft, no guarding, tender in epigastrium and suprapubic region. Bowel sounds active.  Musculoskeletal: no clubbing / cyanosis. No joint deformity upper and lower extremities.   Skin: no significant rashes, lesions, ulcers. Warm, dry, well-perfused. Neurologic: CN 2-12 grossly intact. Moving all extremities.  Alert and oriented.  Psychiatric: Calm. Cooperative.    Labs and Imaging on Admission: I have personally reviewed following labs and imaging studies  CBC: Recent Labs  Lab 08/15/24 0853 08/16/24 0838  WBC 5.1 5.7  HGB 14.6 13.9  HCT 41.8 40.2  MCV 91.7 92.4  PLT 182 181   Basic Metabolic Panel: Recent Labs  Lab 08/15/24 0853 08/16/24 0838  NA 136 139  K 3.4* 3.7  CL 99 103  CO2 23 25  GLUCOSE 128* 79  BUN 8 8  CREATININE 0.96 0.77  CALCIUM 9.0 8.9   GFR: Estimated Creatinine Clearance: 97.3 mL/min (by C-G formula based on SCr of 0.77 mg/dL). Liver Function Tests: Recent Labs  Lab 08/15/24 1145 08/16/24 0838  AST 34 30  ALT 41 37  ALKPHOS 45 46  BILITOT 0.3 0.3  PROT 6.8 7.2  ALBUMIN 3.8 4.0   Recent Labs  Lab 08/15/24 0853 08/16/24 0838  LIPASE 31 26   No results for input(s): AMMONIA in the last 168 hours. Coagulation Profile: No results for input(s): INR, PROTIME in the last 168 hours. Cardiac Enzymes: No results for input(s): CKTOTAL, CKMB, CKMBINDEX, TROPONINI in the last 168 hours. BNP (last 3 results) No results for input(s): PROBNP in the last 8760 hours. HbA1C: No results for input(s): HGBA1C in the last 72 hours. CBG: No results for input(s):  GLUCAP in the last 168 hours. Lipid Profile: No results for input(s): CHOL, HDL, LDLCALC, TRIG, CHOLHDL, LDLDIRECT in the last 72 hours. Thyroid  Function Tests: Recent Labs    08/15/24 1230  TSH 1.060  FREET4 1.17   Anemia Panel: No results for input(s): VITAMINB12, FOLATE, FERRITIN, TIBC, IRON, RETICCTPCT in the last 72 hours. Urine analysis:    Component Value Date/Time   COLORURINE AMBER (A) 08/16/2024 0838   APPEARANCEUR CLOUDY (A) 08/16/2024 0838   LABSPEC 1.029 08/16/2024 0838   PHURINE 5.0 08/16/2024 0838   GLUCOSEU NEGATIVE 08/16/2024 0838   HGBUR MODERATE (A) 08/16/2024 0838   BILIRUBINUR NEGATIVE 08/16/2024 0838   BILIRUBINUR n 06/08/2012 1200   KETONESUR 20 (A) 08/16/2024 9161   PROTEINUR  100 (A) 08/16/2024 0838   UROBILINOGEN 0.2 06/08/2012 1200   NITRITE NEGATIVE 08/16/2024 0838   LEUKOCYTESUR SMALL (A) 08/16/2024 0838   Sepsis Labs: @LABRCNTIP (procalcitonin:4,lacticidven:4) ) Recent Results (from the past 240 hours)  Resp panel by RT-PCR (RSV, Flu A&B, Covid) Anterior Nasal Swab     Status: None   Collection Time: 08/15/24 12:30 PM   Specimen: Anterior Nasal Swab  Result Value Ref Range Status   SARS Coronavirus 2 by RT PCR NEGATIVE NEGATIVE Final    Comment: (NOTE) SARS-CoV-2 target nucleic acids are NOT DETECTED.  The SARS-CoV-2 RNA is generally detectable in upper respiratory specimens during the acute phase of infection. The lowest concentration of SARS-CoV-2 viral copies this assay can detect is 138 copies/mL. A negative result does not preclude SARS-Cov-2 infection and should not be used as the sole basis for treatment or other patient management decisions. A negative result may occur with  improper specimen collection/handling, submission of specimen other than nasopharyngeal swab, presence of viral mutation(s) within the areas targeted by this assay, and inadequate number of viral copies(<138 copies/mL). A negative  result must be combined with clinical observations, patient history, and epidemiological information. The expected result is Negative.  Fact Sheet for Patients:  bloggercourse.com  Fact Sheet for Healthcare Providers:  seriousbroker.it  This test is no t yet approved or cleared by the [place]  FDA and  has been authorized for detection and/or diagnosis of SARS-CoV-2 by FDA under an Emergency Use Authorization (EUA). This EUA will remain  in effect (meaning this test can be used) for the duration of the COVID-19 declaration under Section 564(b)(1) of the Act, 21 U.S.C.section 360bbb-3(b)(1), unless the authorization is terminated  or revoked sooner.       Influenza A by PCR NEGATIVE NEGATIVE Final   Influenza B by PCR NEGATIVE NEGATIVE Final    Comment: (NOTE) The Xpert Xpress SARS-CoV-2/FLU/RSV plus assay is intended as an aid in the diagnosis of influenza from Nasopharyngeal swab specimens and should not be used as a sole basis for treatment. Nasal washings and aspirates are unacceptable for Xpert Xpress SARS-CoV-2/FLU/RSV testing.  Fact Sheet for Patients: bloggercourse.com  Fact Sheet for Healthcare Providers: seriousbroker.it  This test is not yet approved or cleared by the [place]  FDA and has been authorized for detection and/or diagnosis of SARS-CoV-2 by FDA under an Emergency Use Authorization (EUA). This EUA will remain in effect (meaning this test can be used) for the duration of the COVID-19 declaration under Section 564(b)(1) of the Act, 21 U.S.C. section 360bbb-3(b)(1), unless the authorization is terminated or revoked.     Resp Syncytial Virus by PCR NEGATIVE NEGATIVE Final    Comment: (NOTE) Fact Sheet for Patients: bloggercourse.com  Fact Sheet for Healthcare Providers: seriousbroker.it  This  test is not yet approved or cleared by the [place]  FDA and has been authorized for detection and/or diagnosis of SARS-CoV-2 by FDA under an Emergency Use Authorization (EUA). This EUA will remain in effect (meaning this test can be used) for the duration of the COVID-19 declaration under Section 564(b)(1) of the Act, 21 U.S.C. section 360bbb-3(b)(1), unless the authorization is terminated or revoked.  Performed at [facility], [address], [place], [place] [address]   Wet prep, genital     Status: None   Collection Time: 08/15/24 12:51 PM  Result Value Ref Range Status   Yeast Wet Prep HPF POC NONE SEEN NONE SEEN Final   Trich, Wet Prep NONE SEEN NONE SEEN Final  Comment: Specimen diluted due to transport tube containing more than 1 ml of saline, interpret results with caution.   Clue Cells Wet Prep HPF POC NONE SEEN NONE SEEN Final   WBC, Wet Prep HPF POC <10 <10 Final   Sperm NONE SEEN  Final    Comment: Performed at [facility], [address], [place], [place] [address]  Blood culture (routine x 2)     Status: None (Preliminary result)   Collection Time: 08/15/24  3:00 PM   Specimen: BLOOD  Result Value Ref Range Status   Specimen Description   Final    BLOOD RIGHT ANTECUBITAL Performed at [facility], [address] [place], [place] [address]    Special Requests   Final    BOTTLES DRAWN AEROBIC AND ANAEROBIC Blood Culture results may not be optimal due to an excessive volume of blood received in culture bottles Performed at [facility], [address] [place], [place] [address]    Culture   Final    NO GROWTH < 24 HOURS Performed at [facility], [address] [place], [place] [address]    Report Status PENDING  Incomplete     Radiological Exams on Admission: CT Renal Stone Study Result Date: 08/16/2024 EXAM: CT ABDOMEN AND PELVIS WITHOUT CONTRAST 08/16/2024 07:20:59  PM TECHNIQUE: CT of the abdomen and pelvis was performed without the administration of intravenous contrast. Multiplanar reformatted images are provided for review. Automated exposure control, iterative reconstruction, and/or weight-based adjustment of the mA/kV was utilized to reduce the radiation dose to as low as reasonably achievable. COMPARISON: CT abdomen and pelvis 08/15/2024. CLINICAL HISTORY: Abdominal/flank pain, stone suspected. FINDINGS: LOWER CHEST: No acute abnormality. LIVER: The liver is unremarkable. GALLBLADDER AND BILE DUCTS: Gallstones and excreted contrast in the gallbladder. No biliary ductal dilatation. SPLEEN: No acute abnormality. PANCREAS: No acute abnormality. ADRENAL GLANDS: No acute abnormality. KIDNEYS, URETERS AND BLADDER: No stones in the kidneys or ureters. No hydronephrosis. No perinephric or periureteral stranding. There is a small amount of hyperdensity throughout the bladder likely related to residual contrast from yesterday. GI AND BOWEL: Stomach demonstrates no acute abnormality. The appendix appears within normal limits. There is no bowel obstruction. PERITONEUM AND RETROPERITONEUM: There is trace free fluid in the pelvis. No free air. VASCULATURE: Aorta is normal in caliber. LYMPH NODES: Enlarged left inguinal lymph node measures 12 mm, unchanged. REPRODUCTIVE ORGANS: There is an IUD in the uterus. BONES AND SOFT TISSUES: No acute osseous abnormality. No focal soft tissue abnormality. IMPRESSION: 1. No acute findings in the abdomen or pelvis. 2. Cholelithiasis. 3. Enlarged left inguinal lymph node measuring 12 mm, unchanged. Electronically signed by: Greig Pique MD 08/16/2024 07:32 PM EST RP Workstation: [id number]   US  Abdomen Limited RUQ (LIVER/GB) Result Date: 08/16/2024 EXAM: Right Upper Quadrant Abdominal Ultrasound 08/16/2024 09:50:26 AM TECHNIQUE: Real-time ultrasonography of the right upper quadrant of the abdomen was performed. COMPARISON: 08/15/2024 CLINICAL  HISTORY: RUQ abdominal pain. FINDINGS: LIVER: Normal echogenicity. No intrahepatic biliary ductal dilatation. No evidence of mass. Hepatopetal flow in the portal vein. BILIARY SYSTEM: Single mobile gallstone noted. No pericholecystic fluid or wall thickening. While no sonographic Beverley sign was appreciated, the patient had received pain medication prior to the exam. Common bile duct is within normal limits measuring 1.6 mm. RIGHT KIDNEY: No hydronephrosis. No echogenic calculi. No mass. PANCREAS: Visualized portions of the pancreas are unremarkable. OTHER: No right upper quadrant ascites. IMPRESSION: 1. Single mobile gallstone. No sonographic changes to suggest acute  cholecystitis. Electronically signed by: Rogelia Myers MD 08/16/2024 10:13 AM EST RP Workstation: [id number]   US  Abdomen Limited RUQ (LIVER/GB) Result Date: 08/15/2024 EXAM: Right Upper Quadrant Abdominal Ultrasound 08/15/2024 04:24:46 PM TECHNIQUE: Real-time ultrasonography of the right upper quadrant of the abdomen was performed. COMPARISON: CT of the abdomen and pelvis dated 08/15/2024. CLINICAL HISTORY: Cholelithiasis. FINDINGS: LIVER: Normal echogenicity. No intrahepatic biliary ductal dilatation. No evidence of mass. Hepatopetal flow in the portal vein. BILIARY SYSTEM: Gallbladder wall thickness measures 2.5 mm, remeasured at the radiologist workstation. Single gallstone measuring 1.2 cm. No pericholecystic fluid. Negative sonographic Murphy sign. The common bile duct measures 3.1 mm. OTHER: No right upper quadrant ascites. IMPRESSION: 1. Single gallstone measuring 1.2 cm. No changes of acute cholecystitis. Electronically signed by: Rogelia Myers MD 08/15/2024 04:46 PM EST RP Workstation: [id number]   CT Angio Chest PE W and/or Wo Contrast Result Date: 08/15/2024 CLINICAL DATA:  Right-sided pleuritic chest pain EXAM: CT ANGIOGRAPHY CHEST WITH CONTRAST TECHNIQUE: Multidetector CT imaging of the chest was performed using the standard  protocol during bolus administration of intravenous contrast. Multiplanar CT image reconstructions and MIPs were obtained to evaluate the vascular anatomy. RADIATION DOSE REDUCTION: This exam was performed according to the departmental dose-optimization program which includes automated exposure control, adjustment of the mA and/or kV according to patient size and/or use of iterative reconstruction technique. CONTRAST:  OMNIPAQUE  IOHEXOL  350 MG/ML SOLN COMPARISON:  Same day chest radiograph FINDINGS: Cardiovascular: The study is high quality for the evaluation of pulmonary embolism. There are no filling defects in the central, lobar, segmental or subsegmental pulmonary artery branches to suggest acute pulmonary embolism. Great vessels are normal in course and caliber. Normal heart size. No significant pericardial fluid/thickening. Mediastinum/Nodes: Imaged thyroid  gland without nodules meeting criteria for imaging follow-up by size. Normal esophagus. No pathologically enlarged axillary, supraclavicular, mediastinal, or hilar lymph nodes. Lungs/Pleura: The central airways are patent. No focal consolidation. No pneumothorax. No pleural effusion. Upper abdomen: Please see separately dictated CT abdomen and pelvis report for detailed findings. Musculoskeletal: No acute or abnormal lytic or blastic osseous lesions. Chronic concavity of the anterior right chest wall. Review of the MIP images confirms the above findings. IMPRESSION: No acute pulmonary embolism. Electronically Signed   By: Limin  Xu M.D.   On: 08/15/2024 14:57   CT ABDOMEN PELVIS W CONTRAST Result Date: 08/15/2024 EXAM: CT ABDOMEN AND PELVIS WITH CONTRAST 08/15/2024 02:15:05 PM TECHNIQUE: CT of the abdomen and pelvis was performed with the administration of 100 mL of iohexol  (OMNIPAQUE ) 350 MG/ML injection. Multiplanar reformatted images are provided for review. Automated exposure control, iterative reconstruction, and/or weight-based adjustment of  the mA/kV was utilized to reduce the radiation dose to as low as reasonably achievable. COMPARISON: Ultrasound pelvis 1:5:26 CLINICAL HISTORY: LLQ abdominal pain. FINDINGS: LOWER CHEST: Please see separately dictated CT angiography chest 08/15/2024. LIVER: The liver is enlarged, measuring up to 20 cm. GALLBLADDER AND BILE DUCTS: Calcified stone within the gallbladder lumen. No gallbladder wall thickening. No pericholecystic fluid. No biliary ductal dilatation. SPLEEN: No acute abnormality. PANCREAS: No acute abnormality. ADRENAL GLANDS: No acute abnormality. KIDNEYS, URETERS AND BLADDER: No stones in the kidneys or ureters. No hydronephrosis. No perinephric or periureteral stranding. Urinary bladder is unremarkable. GI AND BOWEL: Stomach demonstrates no acute abnormality. The appendix measures at the upper limits of normal (7 mm). No associated periappendiceal fat stranding or inflammatory changes within the right lower quadrant. There is no bowel obstruction. PERITONEUM AND RETROPERITONEUM: No ascites. No free air. VASCULATURE: Aorta  is normal in caliber. LYMPH NODES: Asymmetrically prominent but non-enlarged 1.1 cm left inguinal lymph node (2.76). No lymphadenopathy. REPRODUCTIVE ORGANS: A T-shaped intrauterine device is noted within the uterus in appropriate position. The uterus is otherwise unremarkable. No adnexal mass. BONES AND SOFT TISSUES: No acute osseous abnormality. No focal soft tissue abnormality. IMPRESSION: 1. Appendix measures at the upper limits of normal (7 mm) without periappendiceal fat stranding or inflammatory change. 2. Cholelithiasis without CT evidence of acute cholecystitis. 3. Hepatomegaly. Electronically signed by: Morgane Naveau MD 08/15/2024 02:52 PM EST RP Workstation: [id number]   US  PELVIC COMPLETE W TRANSVAGINAL AND TORSION R/O Result Date: 08/15/2024 CLINICAL DATA:  Pelvic pain. EXAM: TRANSABDOMINAL AND TRANSVAGINAL ULTRASOUND OF PELVIS DOPPLER ULTRASOUND OF OVARIES TECHNIQUE:  Both transabdominal and transvaginal ultrasound examinations of the pelvis were performed. Transabdominal technique was performed for global imaging of the pelvis including uterus, ovaries, adnexal regions, and pelvic cul-de-sac. It was necessary to proceed with endovaginal exam following the transabdominal exam to visualize the uterus, endometrium, ovaries and adnexal regions. Color and duplex Doppler ultrasound was utilized to evaluate blood flow to the ovaries. COMPARISON:  None Available. FINDINGS: Uterus Measurements: 9.3 x 3.9 x 5.2 cm = volume: 97.5 mL. No fibroids or other mass visualized. Endometrium Thickness: 7 mm, within normal limits. Intrauterine contraceptive device is appropriately positioned. Right ovary Measurements: 3.1 x 1.6 x 2.3 cm = volume:  6.0 mL. Normal appearance/no adnexal mass. Doppler: There is normal vascularity on color doppler examination. Spectral doppler arterial and venous waveforms are normal. Left ovary Measurements: 2.2 x 1.6 x 1.5 cm = volume: 2.8 mL. Normal appearance/no adnexal mass. Doppler: There is normal vascularity on color doppler examination. Spectral doppler arterial and venous waveforms are normal. Other findings No abnormal free fluid. IMPRESSION: 1. Normal exam. 2. Intrauterine contraceptive device in place. Electronically Signed   By: Newell Eke M.D.   On: 08/15/2024 14:29   DG Chest 2 View Result Date: 08/15/2024 CLINICAL DATA:  Right-sided pleuritic chest pain.  Epigastric pain. EXAM: CHEST - 2 VIEW COMPARISON:  09/20/2019 FINDINGS: The heart size and mediastinal contours are within normal limits. Both lungs are clear. No evidence of pneumothorax or pleural effusion. The visualized skeletal structures are unremarkable. IMPRESSION: No active cardiopulmonary disease. Electronically Signed   By: Norleen DELENA Kil M.D.   On: 08/15/2024 09:45    Assessment/Plan   1. Intractable abdominal pain; UTI; ?PID  - Imaging and labs are reassuring; she is not septic  on admission  - Treat possible UTI and PID with Rocephin , doxycycline , and Flagyl , continue pain-control    2. Asthma  - Not in exacerbation  - Albuterol  as-needed    DVT prophylaxis: Lovenox   Code Status: Full  Level of Care: Level of care: Med-Surg Family Communication: Husband at bedside   Disposition Plan:  Patient is from: Home  Anticipated d/c is to: Home  Anticipated d/c date is: 1/7 or 08/19/23  Patient currently: Pending pain-control  Consults called: None  Admission status: Observation     Evalene GORMAN Sprinkles, MD [facility]  08/16/2024, 9:43 PM       [1]  Allergies Allergen Reactions   Other     Seasonal allergies   "

## 2024-08-16 NOTE — ED Notes (Signed)
 Pt asking for more pain medications, provider messaged

## 2024-08-16 NOTE — ED Triage Notes (Signed)
 Pt. Stated, Jesus had mid back that goes around to my stomach since Saturday. Ive had nausea

## 2024-08-16 NOTE — ED Provider Notes (Signed)
 " [place] [facility] Provider Note   CSN: [id number] Arrival date & time: 08/16/24  9272     Patient presents with: Back Pain, Abdominal Pain, and Nausea   Erin Pratt is a 34 y.o. female who presents emergency department with a chief complaint of abdominal and back pain.  Patient reports that she has recently married and returned from honeymoon.  She was 16 and had an extensive workup yesterday for suspected UTI versus PID.  She had benign appearing urine yesterday she underwent a right upper quadrant ultrasound due to pain in her epigastrium right upper quadrant and upper right flank area that showed single mobile gallstone.  She also was started on doxycycline .  She returns today due to continued severe pain and nausea.  She does seem to have some postprandial pain about 2 to 3 hours after eating but she has had persistent pain radiating around to the right upper quadrant into her right flank.  She also has suprapubic pain and dysuria.    Back Pain Associated symptoms: abdominal pain   Abdominal Pain      Prior to Admission medications  Medication Sig Start Date End Date Taking? Authorizing Provider  albuterol  (PROVENTIL  HFA;VENTOLIN  HFA) 108 (90 Base) MCG/ACT inhaler INHALE 2 PUFFS INTO THE LUNGS EVERY 4 (FOUR) HOURS AS NEEDED FOR WHEEZING OR SHORTNESS OF BREATH. 11/08/18   Mercer Clotilda SAUNDERS, MD  azelastine  (ASTELIN ) 0.1 % nasal spray Place 1 spray into both nostrils 2 (two) times daily. Use in each nostril as directed Patient taking differently: Place 1 spray into both nostrils as needed. Use in each nostril as directed 07/20/22   Vivienne Delon HERO, PA-C  doxycycline  (VIBRAMYCIN ) 100 MG capsule Take 1 capsule (100 mg total) by mouth 2 (two) times daily. 08/15/24   Doretha Folks, MD  hydrOXYzine  (VISTARIL ) 25 MG capsule Take 1 capsule (25 mg total) by mouth every 8 (eight) hours as needed. 10/16/23   McElwee, Lauren A, NP  ibuprofen  (ADVIL ) 800 MG  tablet Take 1 tablet by mouth as needed.    [provider]  JUNEL FE 1/20 1-20 MG-MCG tablet Take 1 tablet by mouth daily. 06/07/24   [provider]  levonorgestrel (MIRENA, 52 MG,) 20 MCG/DAY IUD 1 each by Intrauterine route once. 08/12/16   [provider]    Allergies: Other    Review of Systems  Gastrointestinal:  Positive for abdominal pain.  Musculoskeletal:  Positive for back pain.    Updated Vital Signs BP 103/73   Pulse 65   Temp 97.8 F (36.6 C) (Oral)   Resp 14   Ht 5' 7 (1.702 m)   Wt 72.6 kg   SpO2 100%   BMI 25.06 kg/m   Physical Exam Vitals and nursing note reviewed.  Constitutional:      General: She is not in acute distress.    Appearance: She is well-developed. She is not diaphoretic.  HENT:     Head: Normocephalic and atraumatic.     Right Ear: External ear normal.     Left Ear: External ear normal.     Nose: Nose normal.     Mouth/Throat:     Mouth: Mucous membranes are moist.  Eyes:     General: No scleral icterus.    Conjunctiva/sclera: Conjunctivae normal.  Cardiovascular:     Rate and Rhythm: Normal rate and regular rhythm.     Heart sounds: Normal heart sounds. No murmur heard.    No friction  rub. No gallop.  Pulmonary:     Effort: Pulmonary effort is normal. No respiratory distress.     Breath sounds: Normal breath sounds.  Abdominal:     General: Bowel sounds are normal. There is no distension.     Palpations: Abdomen is soft. There is no mass.     Tenderness: There is abdominal tenderness in the epigastric area and suprapubic area. There is right CVA tenderness. There is no guarding. Negative signs include Murphy's sign.  Musculoskeletal:     Cervical back: Normal range of motion.  Skin:    General: Skin is warm and dry.  Neurological:     Mental Status: She is alert and oriented to person, place, and time.  Psychiatric:        Behavior: Behavior normal.     (all labs ordered are listed, but only  abnormal results are displayed) Labs Reviewed  URINALYSIS, ROUTINE W REFLEX MICROSCOPIC - Abnormal; Notable for the following components:      Result Value   Color, Urine AMBER (*)    APPearance CLOUDY (*)    Hgb urine dipstick MODERATE (*)    Ketones, ur 20 (*)    Protein, ur 100 (*)    Leukocytes,Ua SMALL (*)    Bacteria, UA MANY (*)    All other components within normal limits  URINE CULTURE  LIPASE, BLOOD  COMPREHENSIVE METABOLIC PANEL WITH GFR  CBC  HCG, SERUM, QUALITATIVE  I-STAT CG4 LACTIC ACID, ED    EKG: None  Radiology: US  Abdomen Limited RUQ (LIVER/GB) Result Date: 08/16/2024 EXAM: Right Upper Quadrant Abdominal Ultrasound 08/16/2024 09:50:26 AM TECHNIQUE: Real-time ultrasonography of the right upper quadrant of the abdomen was performed. COMPARISON: 08/15/2024 CLINICAL HISTORY: RUQ abdominal pain. FINDINGS: LIVER: Normal echogenicity. No intrahepatic biliary ductal dilatation. No evidence of mass. Hepatopetal flow in the portal vein. BILIARY SYSTEM: Single mobile gallstone noted. No pericholecystic fluid or wall thickening. While no sonographic Beverley sign was appreciated, the patient had received pain medication prior to the exam. Common bile duct is within normal limits measuring 1.6 mm. RIGHT KIDNEY: No hydronephrosis. No echogenic calculi. No mass. PANCREAS: Visualized portions of the pancreas are unremarkable. OTHER: No right upper quadrant ascites. IMPRESSION: 1. Single mobile gallstone. No sonographic changes to suggest acute cholecystitis. Electronically signed by: Rogelia Myers MD 08/16/2024 10:13 AM EST RP Workstation: [id number]   US  Abdomen Limited RUQ (LIVER/GB) Result Date: 08/15/2024 EXAM: Right Upper Quadrant Abdominal Ultrasound 08/15/2024 04:24:46 PM TECHNIQUE: Real-time ultrasonography of the right upper quadrant of the abdomen was performed. COMPARISON: CT of the abdomen and pelvis dated 08/15/2024. CLINICAL HISTORY: Cholelithiasis. FINDINGS: LIVER: Normal  echogenicity. No intrahepatic biliary ductal dilatation. No evidence of mass. Hepatopetal flow in the portal vein. BILIARY SYSTEM: Gallbladder wall thickness measures 2.5 mm, remeasured at the radiologist workstation. Single gallstone measuring 1.2 cm. No pericholecystic fluid. Negative sonographic Murphy sign. The common bile duct measures 3.1 mm. OTHER: No right upper quadrant ascites. IMPRESSION: 1. Single gallstone measuring 1.2 cm. No changes of acute cholecystitis. Electronically signed by: Rogelia Myers MD 08/15/2024 04:46 PM EST RP Workstation: [id number]   CT Angio Chest PE W and/or Wo Contrast Result Date: 08/15/2024 CLINICAL DATA:  Right-sided pleuritic chest pain EXAM: CT ANGIOGRAPHY CHEST WITH CONTRAST TECHNIQUE: Multidetector CT imaging of the chest was performed using the standard protocol during bolus administration of intravenous contrast. Multiplanar CT image reconstructions and MIPs were obtained to evaluate the vascular anatomy. RADIATION DOSE REDUCTION: This exam was performed according to the departmental  dose-optimization program which includes automated exposure control, adjustment of the mA and/or kV according to patient size and/or use of iterative reconstruction technique. CONTRAST:  OMNIPAQUE  IOHEXOL  350 MG/ML SOLN COMPARISON:  Same day chest radiograph FINDINGS: Cardiovascular: The study is high quality for the evaluation of pulmonary embolism. There are no filling defects in the central, lobar, segmental or subsegmental pulmonary artery branches to suggest acute pulmonary embolism. Great vessels are normal in course and caliber. Normal heart size. No significant pericardial fluid/thickening. Mediastinum/Nodes: Imaged thyroid  gland without nodules meeting criteria for imaging follow-up by size. Normal esophagus. No pathologically enlarged axillary, supraclavicular, mediastinal, or hilar lymph nodes. Lungs/Pleura: The central airways are patent. No focal consolidation. No  pneumothorax. No pleural effusion. Upper abdomen: Please see separately dictated CT abdomen and pelvis report for detailed findings. Musculoskeletal: No acute or abnormal lytic or blastic osseous lesions. Chronic concavity of the anterior right chest wall. Review of the MIP images confirms the above findings. IMPRESSION: No acute pulmonary embolism. Electronically Signed   By: Limin  Xu M.D.   On: 08/15/2024 14:57   CT ABDOMEN PELVIS W CONTRAST Result Date: 08/15/2024 EXAM: CT ABDOMEN AND PELVIS WITH CONTRAST 08/15/2024 02:15:05 PM TECHNIQUE: CT of the abdomen and pelvis was performed with the administration of 100 mL of iohexol  (OMNIPAQUE ) 350 MG/ML injection. Multiplanar reformatted images are provided for review. Automated exposure control, iterative reconstruction, and/or weight-based adjustment of the mA/kV was utilized to reduce the radiation dose to as low as reasonably achievable. COMPARISON: Ultrasound pelvis 1:5:26 CLINICAL HISTORY: LLQ abdominal pain. FINDINGS: LOWER CHEST: Please see separately dictated CT angiography chest 08/15/2024. LIVER: The liver is enlarged, measuring up to 20 cm. GALLBLADDER AND BILE DUCTS: Calcified stone within the gallbladder lumen. No gallbladder wall thickening. No pericholecystic fluid. No biliary ductal dilatation. SPLEEN: No acute abnormality. PANCREAS: No acute abnormality. ADRENAL GLANDS: No acute abnormality. KIDNEYS, URETERS AND BLADDER: No stones in the kidneys or ureters. No hydronephrosis. No perinephric or periureteral stranding. Urinary bladder is unremarkable. GI AND BOWEL: Stomach demonstrates no acute abnormality. The appendix measures at the upper limits of normal (7 mm). No associated periappendiceal fat stranding or inflammatory changes within the right lower quadrant. There is no bowel obstruction. PERITONEUM AND RETROPERITONEUM: No ascites. No free air. VASCULATURE: Aorta is normal in caliber. LYMPH NODES: Asymmetrically prominent but non-enlarged 1.1  cm left inguinal lymph node (2.76). No lymphadenopathy. REPRODUCTIVE ORGANS: A T-shaped intrauterine device is noted within the uterus in appropriate position. The uterus is otherwise unremarkable. No adnexal mass. BONES AND SOFT TISSUES: No acute osseous abnormality. No focal soft tissue abnormality. IMPRESSION: 1. Appendix measures at the upper limits of normal (7 mm) without periappendiceal fat stranding or inflammatory change. 2. Cholelithiasis without CT evidence of acute cholecystitis. 3. Hepatomegaly. Electronically signed by: Morgane Naveau MD 08/15/2024 02:52 PM EST RP Workstation: [id number]   US  PELVIC COMPLETE W TRANSVAGINAL AND TORSION R/O Result Date: 08/15/2024 CLINICAL DATA:  Pelvic pain. EXAM: TRANSABDOMINAL AND TRANSVAGINAL ULTRASOUND OF PELVIS DOPPLER ULTRASOUND OF OVARIES TECHNIQUE: Both transabdominal and transvaginal ultrasound examinations of the pelvis were performed. Transabdominal technique was performed for global imaging of the pelvis including uterus, ovaries, adnexal regions, and pelvic cul-de-sac. It was necessary to proceed with endovaginal exam following the transabdominal exam to visualize the uterus, endometrium, ovaries and adnexal regions. Color and duplex Doppler ultrasound was utilized to evaluate blood flow to the ovaries. COMPARISON:  None Available. FINDINGS: Uterus Measurements: 9.3 x 3.9 x 5.2 cm = volume: 97.5 mL. No fibroids  or other mass visualized. Endometrium Thickness: 7 mm, within normal limits. Intrauterine contraceptive device is appropriately positioned. Right ovary Measurements: 3.1 x 1.6 x 2.3 cm = volume:  6.0 mL. Normal appearance/no adnexal mass. Doppler: There is normal vascularity on color doppler examination. Spectral doppler arterial and venous waveforms are normal. Left ovary Measurements: 2.2 x 1.6 x 1.5 cm = volume: 2.8 mL. Normal appearance/no adnexal mass. Doppler: There is normal vascularity on color doppler examination. Spectral doppler arterial  and venous waveforms are normal. Other findings No abnormal free fluid. IMPRESSION: 1. Normal exam. 2. Intrauterine contraceptive device in place. Electronically Signed   By: Newell Eke M.D.   On: 08/15/2024 14:29   DG Chest 2 View Result Date: 08/15/2024 CLINICAL DATA:  Right-sided pleuritic chest pain.  Epigastric pain. EXAM: CHEST - 2 VIEW COMPARISON:  09/20/2019 FINDINGS: The heart size and mediastinal contours are within normal limits. Both lungs are clear. No evidence of pneumothorax or pleural effusion. The visualized skeletal structures are unremarkable. IMPRESSION: No active cardiopulmonary disease. Electronically Signed   By: Norleen DELENA Kil M.D.   On: 08/15/2024 09:45     Procedures   Medications Ordered in the ED  cefTRIAXone  (ROCEPHIN ) 1 g in sodium chloride  0.9 % 100 mL IVPB (has no administration in time range)  ketorolac  (TORADOL ) 30 MG/ML injection 30 mg (has no administration in time range)  ondansetron  (ZOFRAN ) injection 4 mg (has no administration in time range)  HYDROmorphone  (DILAUDID ) injection 1 mg (1 mg Intravenous Given 08/16/24 0900)  ondansetron  (ZOFRAN ) injection 4 mg (4 mg Intravenous Given 08/16/24 0859)  sodium chloride  0.9 % bolus 1,000 mL (1,000 mLs Intravenous New Bag/Given 08/16/24 1757)  HYDROmorphone  (DILAUDID ) injection 1 mg (1 mg Intravenous Given 08/16/24 1320)    Clinical Course as of 08/18/24 1404  Tue Aug 16, 2024  1820 Urinalysis, Routine w reflex microscopic -Urine, Clean Catch(!) [AH]  2117 Patient continues to have sever pain, currently 7/10. She has had multiple rounds of Iv pain medications. Currently patient would prefer admission for pain control  [AH]    Clinical Course User Index [AH] Arloa Chroman, PA-C                                 Medical Decision Making Amount and/or Complexity of Data Reviewed Labs: ordered. Decision-making details documented in ED Course. Radiology: ordered.  Risk Prescription drug management. Decision  regarding hospitalization.    33y/o F RQ abdominal pain and pelvic pain Extensive WU yesterday with multiple gallstone, negative CT abdomen with contrast, pelvic ultrasound showed no torsion or evidence of tubo-ovarian abscess.  Patient had GC chlamydia testing that was negative.  Low suspicion for PID. Patient just got off her honeymoon and urine today does show what appears to be infection.  She continues to have severe right upper quadrant and flank pain with nausea uncontrolled after multiple rounds of parenteral narcotic medication. BMP and CMP appear to be.  Urine shows many bacteria greater than 21-50 white blood cells.  Greater than 50 red blood cells per high-powered field.  CT renal stone study is negative for acute finding.  Ultrasound of the right upper quadrant shows a single mobile gallstone.  On exam patient is negative for Murphy sign.  She is positive for CVA tenderness.  She has no rash suggestive of herpes zoster.  This time patient requests admission for pain control.  Case discussed with Dr. Charlton.  She appears  appropriate for admission for severe pain suspected pyelonephritis    I reviewed the patient's labs today.    Final diagnoses:  None    ED Discharge Orders     None          Arloa Chroman, PA-C 08/18/24 1408    Franklyn Sid SAILOR, MD 08/18/24 1704  "

## 2024-08-16 NOTE — ED Provider Triage Note (Signed)
 Emergency Medicine Provider Triage Evaluation Note  Erin Pratt , a 34 y.o. female  was evaluated in triage.  Pt complains of acute worsening pain in her right upper quadrant wraparound towards her back with nausea..  Review of Systems  Positive: Malaise, right upper quadrant abdominal pain going around towards back and shoulder.  Nausea. Negative: No rash, trauma, congestion, or cough.  Physical Exam  BP 118/66   Pulse 93   Temp 98 F (36.7 C)   Resp 17   Ht 5' 7 (1.702 m)   Wt 72.6 kg   SpO2 98%   BMI 25.06 kg/m  Gen:   Awake, no distress, miserable and ill-appearing Resp:  Normal effort, breath sounds clear bilaterally MSK:   Moves extremities without difficulty  Other:  Significant tenderness in right upper quadrant.  Rest the abdomen less tender.  No tenderness on CVA areas  Medical Decision Making  Medically screening exam initiated at 8:24 AM.  Appropriate orders placed.  Erin Pratt was informed that the remainder of the evaluation will be completed by another provider, this initial triage assessment does not replace that evaluation, and the importance of remaining in the ED until their evaluation is complete.  Erin Pratt is a 34 y.o. female with a past medical history significant for anxiety, asthma, previous C-section, and recent visit to the hospital yesterday and was found to have suspected PID and symptomatic cholelithiasis who presents with acutely worsening pain and nausea.  According to patient, she was seen yesterday and had a workup that revealed possible PID and suspected symptomatic cholelithiasis but otherwise did not show PE or other acute surgical problem initially.  The appendix was also slightly dilated at the upper [place] of normal but general surgery not feel this was appendicitis.  Patient reports that this morning she woke up with severe pain primarily in her right upper quadrant that goes around towards her back and is 10 out of 10.  She is  retching and having dry heaving and nausea.  She is very uncomfortable appearing.  She is sweating.  She reports no congestion cough or chest pain initially but has all the right upper quadrant pain.  She still reports some mild right lower quadrant pain but not nearly as bad.  Given the ultrasound showing stones I am somewhat concerned that she has now developed acute cholecystitis.  Will get a repeat ultrasound and get screening labs we will give her pain medicine nausea medicine and fluids.  Anticipate  evaluation by provider when she gets back to her room to determine disposition.         Erin Pratt, Lonni PARAS, MD 08/16/24 [phone]

## 2024-08-17 DIAGNOSIS — K649 Unspecified hemorrhoids: Secondary | ICD-10-CM | POA: Diagnosis present

## 2024-08-17 DIAGNOSIS — Z975 Presence of (intrauterine) contraceptive device: Secondary | ICD-10-CM | POA: Diagnosis not present

## 2024-08-17 DIAGNOSIS — Z793 Long term (current) use of hormonal contraceptives: Secondary | ICD-10-CM | POA: Diagnosis not present

## 2024-08-17 DIAGNOSIS — N739 Female pelvic inflammatory disease, unspecified: Secondary | ICD-10-CM | POA: Diagnosis present

## 2024-08-17 DIAGNOSIS — R10A1 Flank pain, right side: Secondary | ICD-10-CM | POA: Diagnosis present

## 2024-08-17 DIAGNOSIS — J4520 Mild intermittent asthma, uncomplicated: Secondary | ICD-10-CM | POA: Diagnosis present

## 2024-08-17 DIAGNOSIS — Z98891 History of uterine scar from previous surgery: Secondary | ICD-10-CM | POA: Diagnosis not present

## 2024-08-17 DIAGNOSIS — R109 Unspecified abdominal pain: Secondary | ICD-10-CM | POA: Diagnosis not present

## 2024-08-17 DIAGNOSIS — Z1152 Encounter for screening for COVID-19: Secondary | ICD-10-CM | POA: Diagnosis not present

## 2024-08-17 DIAGNOSIS — E876 Hypokalemia: Secondary | ICD-10-CM | POA: Diagnosis present

## 2024-08-17 DIAGNOSIS — D529 Folate deficiency anemia, unspecified: Secondary | ICD-10-CM | POA: Diagnosis present

## 2024-08-17 DIAGNOSIS — Z833 Family history of diabetes mellitus: Secondary | ICD-10-CM | POA: Diagnosis not present

## 2024-08-17 DIAGNOSIS — Z79899 Other long term (current) drug therapy: Secondary | ICD-10-CM | POA: Diagnosis not present

## 2024-08-17 DIAGNOSIS — Z825 Family history of asthma and other chronic lower respiratory diseases: Secondary | ICD-10-CM | POA: Diagnosis not present

## 2024-08-17 DIAGNOSIS — N10 Acute tubulo-interstitial nephritis: Secondary | ICD-10-CM | POA: Diagnosis present

## 2024-08-17 DIAGNOSIS — D696 Thrombocytopenia, unspecified: Secondary | ICD-10-CM | POA: Diagnosis present

## 2024-08-17 DIAGNOSIS — D509 Iron deficiency anemia, unspecified: Secondary | ICD-10-CM | POA: Diagnosis present

## 2024-08-17 DIAGNOSIS — K8020 Calculus of gallbladder without cholecystitis without obstruction: Secondary | ICD-10-CM | POA: Diagnosis present

## 2024-08-17 DIAGNOSIS — Z8249 Family history of ischemic heart disease and other diseases of the circulatory system: Secondary | ICD-10-CM | POA: Diagnosis not present

## 2024-08-17 DIAGNOSIS — F419 Anxiety disorder, unspecified: Secondary | ICD-10-CM | POA: Diagnosis present

## 2024-08-17 LAB — CBC
HCT: 34.4 % — ABNORMAL LOW (ref 36.0–46.0)
Hemoglobin: 12.2 g/dL (ref 12.0–15.0)
MCH: 31.9 pg (ref 26.0–34.0)
MCHC: 35.5 g/dL (ref 30.0–36.0)
MCV: 90.1 fL (ref 80.0–100.0)
Platelets: 141 K/uL — ABNORMAL LOW (ref 150–400)
RBC: 3.82 MIL/uL — ABNORMAL LOW (ref 3.87–5.11)
RDW: 11.7 % (ref 11.5–15.5)
WBC: 3.9 K/uL — ABNORMAL LOW (ref 4.0–10.5)
nRBC: 0 % (ref 0.0–0.2)

## 2024-08-17 LAB — BASIC METABOLIC PANEL WITH GFR
Anion gap: 10 (ref 5–15)
BUN: 7 mg/dL (ref 6–20)
CO2: 24 mmol/L (ref 22–32)
Calcium: 8 mg/dL — ABNORMAL LOW (ref 8.9–10.3)
Chloride: 103 mmol/L (ref 98–111)
Creatinine, Ser: 0.78 mg/dL (ref 0.44–1.00)
GFR, Estimated: >60 mL/min
Glucose, Bld: 109 mg/dL — ABNORMAL HIGH (ref 70–99)
Potassium: 3 mmol/L — ABNORMAL LOW (ref 3.5–5.1)
Sodium: 137 mmol/L (ref 135–145)

## 2024-08-17 LAB — URINE CULTURE
Culture: NO GROWTH
Special Requests: NORMAL

## 2024-08-17 MED ORDER — POTASSIUM CHLORIDE CRYS ER 20 MEQ PO TBCR
40.0000 meq | EXTENDED_RELEASE_TABLET | Freq: Once | ORAL | Status: AC
  Administered 2024-08-17: 40 meq via ORAL
  Filled 2024-08-17: qty 2

## 2024-08-17 MED ORDER — CLOTRIMAZOLE 1 % VA CREA
1.0000 | TOPICAL_CREAM | Freq: Every day | VAGINAL | Status: DC
  Administered 2024-08-17 – 2024-08-18 (×2): 1 via VAGINAL
  Filled 2024-08-17: qty 45

## 2024-08-17 MED ORDER — POLYETHYLENE GLYCOL 3350 17 G PO PACK
17.0000 g | PACK | Freq: Two times a day (BID) | ORAL | Status: DC
  Administered 2024-08-17 – 2024-08-18 (×3): 17 g via ORAL
  Filled 2024-08-17 (×5): qty 1

## 2024-08-17 MED ORDER — PANTOPRAZOLE SODIUM 40 MG PO TBEC
40.0000 mg | DELAYED_RELEASE_TABLET | Freq: Every day | ORAL | Status: DC
  Administered 2024-08-17 – 2024-08-19 (×3): 40 mg via ORAL
  Filled 2024-08-17 (×3): qty 1

## 2024-08-17 MED ORDER — HYDROCODONE-ACETAMINOPHEN 5-325 MG PO TABS
1.0000 | ORAL_TABLET | ORAL | Status: DC | PRN
  Administered 2024-08-17 (×3): 2 via ORAL
  Administered 2024-08-18 (×3): 1 via ORAL
  Administered 2024-08-19: 2 via ORAL
  Filled 2024-08-17: qty 1
  Filled 2024-08-17: qty 2
  Filled 2024-08-17: qty 1
  Filled 2024-08-17 (×2): qty 2
  Filled 2024-08-17: qty 1
  Filled 2024-08-17: qty 2

## 2024-08-17 MED ORDER — HYDROCODONE-ACETAMINOPHEN 5-325 MG PO TABS: 1.0000 | ORAL_TABLET | Freq: Four times a day (QID) | ORAL | Status: DC | PRN

## 2024-08-17 MED ORDER — LACTATED RINGERS IV SOLN: INTRAVENOUS | Status: DC

## 2024-08-17 MED ORDER — LACTATED RINGERS IV BOLUS
1000.0000 mL | Freq: Once | INTRAVENOUS | Status: AC
  Administered 2024-08-17: 1000 mL via INTRAVENOUS

## 2024-08-17 MED ORDER — HYDROCORTISONE 1 % EX CREA
TOPICAL_CREAM | Freq: Two times a day (BID) | CUTANEOUS | Status: AC
  Filled 2024-08-17: qty 28

## 2024-08-17 NOTE — Plan of Care (Signed)

## 2024-08-17 NOTE — ED Notes (Signed)
 Called floor to advise patient will be in route.

## 2024-08-17 NOTE — Progress Notes (Signed)
 " PROGRESS NOTE    Erin Pratt  FMW:[id number] DOB: [birth year] DOA: 08/16/2024 PCP: Nedra Tinnie LABOR, NP   Brief Narrative: 34 year old with past medical history significant for asthma, anxiety, presenting with fatigue, abdominal pain and dysuria.  She was evaluated in the ED 1/5, had extensive workup including negative CTA chest, cholelithiasis without acute cholecystitis, normal pelvic ultrasound and negative STI testing.  Started on empiric treatment for PID returned complaining of severe abdominal pain.  Reports having fever 1/4.  Evaluation in the ED UA positive for bacteriuria, pyuria microscopic hematuria no acute finding on CT abdomen and pelvis.  Ultrasound revealed single gallstone.   Assessment & Plan:   Principal Problem:   Intractable abdominal pain Active Problems:   Mild intermittent asthma without complication   Acute pyelonephritis   1-Intractable abdominal pain, UTI Questionable PID - Patient presents initially January 5 complaining of abdominal pain, dysuria, was initially more right side, CT chest was negative for PE only showed cholelithiasis no cholecystitis. - Presented with uncontrolled pain, poor oral intake. - Patient with positive CVA, dysuria, UA with 21-50 white blood cell. - Continue Doxy, Flagyl , IV ceftriaxone  to cover for PID - She reported some vaginal discharge and also some small bump in her labia area. - She did had chlamydia gonorrhea test performed 2 days ago which was negative. - Still requiring IV Dilaudid  for pain, reports oxycodone  is not helping, changed to Vicodin.  Encouraged to use some IV Toradol  as needed to help with inflammation - Added vaginal clotrimazole  -Will give  IV fluids, IV bolus, reports poor oral intake  Hemorrhoids: She reports history of hemorrhoid and having a flare currently.  Hydrocortisone  ordered  Asthma: As needed albuterol   Hypokalemia: Replete orally  Cholelithiasis, no right upper quadrant pain, normal  liver function test. Symptoms probably more consistent with UTI  Estimated body mass index is 25.06 kg/m as calculated from the following:   Height as of this encounter: 5' 7 (1.702 m).   Weight as of this encounter: 72.6 kg.   DVT prophylaxis: Lovenox  Code Status: Full code Family Communication: Husband who was at bedside Disposition Plan:  Status is: Observation The patient remains OBS appropriate and will d/c before 2 midnights.    Consultants:  None  Procedures:  none  Antimicrobials:    Subjective: She still having no right-sided flank pain, dysuria.  She also reports some vaginal discharge grayish . She also has 2 small bump in her private area. She denies any history of herpes She also has been having issues with hemorrhoids  Objective: Vitals:   08/16/24 2302 08/17/24 0032 08/17/24 0151 08/17/24 0745  BP:  109/68 111/69 104/66  Pulse:  91 (!) 108 (!) 105  Resp:  16  14  Temp: 98.8 F (37.1 C)  99 F (37.2 C)   TempSrc: Oral     SpO2:  100% 98% 100%  Weight:      Height:       No intake or output data in the 24 hours ending 08/17/24 0808 Filed Weights   08/16/24 0803  Weight: 72.6 kg    Examination:  General exam: Appears calm and comfortable  Respiratory system: Clear to auscultation. Respiratory effort normal. Cardiovascular system: S1 & S2 heard, RRR. No JVD, murmurs, rubs, gallops or clicks. No pedal edema. Gastrointestinal system: Abdomen is nondistended, soft and nontender. No organomegaly or masses felt. Normal bowel sounds heard. Central nervous system: Alert and oriented. No focal neurological deficits. Extremities: Symmetric 5 x 5 power.  Data Reviewed: I have personally reviewed following labs and imaging studies  CBC: Recent Labs  Lab 08/15/24 0853 08/16/24 0838 08/17/24 0250  WBC 5.1 5.7 3.9*  HGB 14.6 13.9 12.2  HCT 41.8 40.2 34.4*  MCV 91.7 92.4 90.1  PLT 182 181 141*   Basic Metabolic Panel: Recent Labs  Lab  08/15/24 0853 08/16/24 0838 08/17/24 0250  NA 136 139 137  K 3.4* 3.7 3.0*  CL 99 103 103  CO2 23 25 24   GLUCOSE 128* 79 109*  BUN 8 8 7   CREATININE 0.96 0.77 0.78  CALCIUM 9.0 8.9 8.0*   GFR: Estimated Creatinine Clearance: 97.3 mL/min (by C-G formula based on SCr of 0.78 mg/dL). Liver Function Tests: Recent Labs  Lab 08/15/24 1145 08/16/24 0838  AST 34 30  ALT 41 37  ALKPHOS 45 46  BILITOT 0.3 0.3  PROT 6.8 7.2  ALBUMIN 3.8 4.0   Recent Labs  Lab 08/15/24 0853 08/16/24 0838  LIPASE 31 26   No results for input(s): AMMONIA in the last 168 hours. Coagulation Profile: No results for input(s): INR, PROTIME in the last 168 hours. Cardiac Enzymes: No results for input(s): CKTOTAL, CKMB, CKMBINDEX, TROPONINI in the last 168 hours. BNP (last 3 results) No results for input(s): PROBNP in the last 8760 hours. HbA1C: No results for input(s): HGBA1C in the last 72 hours. CBG: No results for input(s): GLUCAP in the last 168 hours. Lipid Profile: No results for input(s): CHOL, HDL, LDLCALC, TRIG, CHOLHDL, LDLDIRECT in the last 72 hours. Thyroid  Function Tests: Recent Labs    08/15/24 1230  TSH 1.060  FREET4 1.17   Anemia Panel: No results for input(s): VITAMINB12, FOLATE, FERRITIN, TIBC, IRON, RETICCTPCT in the last 72 hours. Sepsis Labs: Recent Labs  Lab 08/16/24 0848  LATICACIDVEN 1.0    Recent Results (from the past 240 hours)  Resp panel by RT-PCR (RSV, Flu A&B, Covid) Anterior Nasal Swab     Status: None   Collection Time: 08/15/24 12:30 PM   Specimen: Anterior Nasal Swab  Result Value Ref Range Status   SARS Coronavirus 2 by RT PCR NEGATIVE NEGATIVE Final    Comment: (NOTE) SARS-CoV-2 target nucleic acids are NOT DETECTED.  The SARS-CoV-2 RNA is generally detectable in upper respiratory specimens during the acute phase of infection. The lowest concentration of SARS-CoV-2 viral copies this assay can detect  is 138 copies/mL. A negative result does not preclude SARS-Cov-2 infection and should not be used as the sole basis for treatment or other patient management decisions. A negative result may occur with  improper specimen collection/handling, submission of specimen other than nasopharyngeal swab, presence of viral mutation(s) within the areas targeted by this assay, and inadequate number of viral copies(<138 copies/mL). A negative result must be combined with clinical observations, patient history, and epidemiological information. The expected result is Negative.  Fact Sheet for Patients:  bloggercourse.com  Fact Sheet for Healthcare Providers:  seriousbroker.it  This test is no t yet approved or cleared by the [place]  FDA and  has been authorized for detection and/or diagnosis of SARS-CoV-2 by FDA under an Emergency Use Authorization (EUA). This EUA will remain  in effect (meaning this test can be used) for the duration of the COVID-19 declaration under Section 564(b)(1) of the Act, 21 U.S.C.section 360bbb-3(b)(1), unless the authorization is terminated  or revoked sooner.       Influenza A by PCR NEGATIVE NEGATIVE Final   Influenza B by PCR NEGATIVE NEGATIVE Final  Comment: (NOTE) The Xpert Xpress SARS-CoV-2/FLU/RSV plus assay is intended as an aid in the diagnosis of influenza from Nasopharyngeal swab specimens and should not be used as a sole basis for treatment. Nasal washings and aspirates are unacceptable for Xpert Xpress SARS-CoV-2/FLU/RSV testing.  Fact Sheet for Patients: bloggercourse.com  Fact Sheet for Healthcare Providers: seriousbroker.it  This test is not yet approved or cleared by the [place]  FDA and has been authorized for detection and/or diagnosis of SARS-CoV-2 by FDA under an Emergency Use Authorization (EUA). This EUA will remain in effect  (meaning this test can be used) for the duration of the COVID-19 declaration under Section 564(b)(1) of the Act, 21 U.S.C. section 360bbb-3(b)(1), unless the authorization is terminated or revoked.     Resp Syncytial Virus by PCR NEGATIVE NEGATIVE Final    Comment: (NOTE) Fact Sheet for Patients: bloggercourse.com  Fact Sheet for Healthcare Providers: seriousbroker.it  This test is not yet approved or cleared by the [place]  FDA and has been authorized for detection and/or diagnosis of SARS-CoV-2 by FDA under an Emergency Use Authorization (EUA). This EUA will remain in effect (meaning this test can be used) for the duration of the COVID-19 declaration under Section 564(b)(1) of the Act, 21 U.S.C. section 360bbb-3(b)(1), unless the authorization is terminated or revoked.  Performed at [facility], [address] [place], [place] [address]   Wet prep, genital     Status: None   Collection Time: 08/15/24 12:51 PM  Result Value Ref Range Status   Yeast Wet Prep HPF POC NONE SEEN NONE SEEN Final   Trich, Wet Prep NONE SEEN NONE SEEN Final    Comment: Specimen diluted due to transport tube containing more than 1 ml of saline, interpret results with caution.   Clue Cells Wet Prep HPF POC NONE SEEN NONE SEEN Final   WBC, Wet Prep HPF POC <10 <10 Final   Sperm NONE SEEN  Final    Comment: Performed at [facility], [address] [place], [place] [address]  Blood culture (routine x 2)     Status: None (Preliminary result)   Collection Time: 08/15/24  3:00 PM   Specimen: BLOOD  Result Value Ref Range Status   Specimen Description   Final    BLOOD RIGHT ANTECUBITAL Performed at [facility], [address], [place], [place] [address]    Special Requests   Final    BOTTLES DRAWN AEROBIC AND ANAEROBIC Blood Culture results may not be optimal due to an excessive volume  of blood received in culture bottles Performed at [facility], [address], [place], [place] [address]    Culture   Final    NO GROWTH 2 DAYS Performed at [facility], [address] [place], [place] [address]    Report Status PENDING  Incomplete         Radiology Studies: CT Renal Stone Study Result Date: 08/16/2024 EXAM: CT ABDOMEN AND PELVIS WITHOUT CONTRAST 08/16/2024 07:20:59 PM TECHNIQUE: CT of the abdomen and pelvis was performed without the administration of intravenous contrast. Multiplanar reformatted images are provided for review. Automated exposure control, iterative reconstruction, and/or weight-based adjustment of the mA/kV was utilized to reduce the radiation dose to as low as reasonably achievable. COMPARISON: CT abdomen and pelvis 08/15/2024. CLINICAL HISTORY: Abdominal/flank pain, stone suspected. FINDINGS: LOWER CHEST: No acute abnormality. LIVER: The liver is unremarkable. GALLBLADDER AND BILE DUCTS: Gallstones and excreted contrast in the gallbladder. No biliary ductal dilatation. SPLEEN:  No acute abnormality. PANCREAS: No acute abnormality. ADRENAL GLANDS: No acute abnormality. KIDNEYS, URETERS AND BLADDER: No stones in the kidneys or ureters. No hydronephrosis. No perinephric or periureteral stranding. There is a small amount of hyperdensity throughout the bladder likely related to residual contrast from yesterday. GI AND BOWEL: Stomach demonstrates no acute abnormality. The appendix appears within normal limits. There is no bowel obstruction. PERITONEUM AND RETROPERITONEUM: There is trace free fluid in the pelvis. No free air. VASCULATURE: Aorta is normal in caliber. LYMPH NODES: Enlarged left inguinal lymph node measures 12 mm, unchanged. REPRODUCTIVE ORGANS: There is an IUD in the uterus. BONES AND SOFT TISSUES: No acute osseous abnormality. No focal soft tissue abnormality. IMPRESSION: 1. No acute findings in the abdomen or pelvis. 2.  Cholelithiasis. 3. Enlarged left inguinal lymph node measuring 12 mm, unchanged. Electronically signed by: Greig Pique MD 08/16/2024 07:32 PM EST RP Workstation: [id number]   US  Abdomen Limited RUQ (LIVER/GB) Result Date: 08/16/2024 EXAM: Right Upper Quadrant Abdominal Ultrasound 08/16/2024 09:50:26 AM TECHNIQUE: Real-time ultrasonography of the right upper quadrant of the abdomen was performed. COMPARISON: 08/15/2024 CLINICAL HISTORY: RUQ abdominal pain. FINDINGS: LIVER: Normal echogenicity. No intrahepatic biliary ductal dilatation. No evidence of mass. Hepatopetal flow in the portal vein. BILIARY SYSTEM: Single mobile gallstone noted. No pericholecystic fluid or wall thickening. While no sonographic Beverley sign was appreciated, the patient had received pain medication prior to the exam. Common bile duct is within normal limits measuring 1.6 mm. RIGHT KIDNEY: No hydronephrosis. No echogenic calculi. No mass. PANCREAS: Visualized portions of the pancreas are unremarkable. OTHER: No right upper quadrant ascites. IMPRESSION: 1. Single mobile gallstone. No sonographic changes to suggest acute cholecystitis. Electronically signed by: Rogelia Myers MD 08/16/2024 10:13 AM EST RP Workstation: [id number]   US  Abdomen Limited RUQ (LIVER/GB) Result Date: 08/15/2024 EXAM: Right Upper Quadrant Abdominal Ultrasound 08/15/2024 04:24:46 PM TECHNIQUE: Real-time ultrasonography of the right upper quadrant of the abdomen was performed. COMPARISON: CT of the abdomen and pelvis dated 08/15/2024. CLINICAL HISTORY: Cholelithiasis. FINDINGS: LIVER: Normal echogenicity. No intrahepatic biliary ductal dilatation. No evidence of mass. Hepatopetal flow in the portal vein. BILIARY SYSTEM: Gallbladder wall thickness measures 2.5 mm, remeasured at the radiologist workstation. Single gallstone measuring 1.2 cm. No pericholecystic fluid. Negative sonographic Murphy sign. The common bile duct measures 3.1 mm. OTHER: No right upper quadrant  ascites. IMPRESSION: 1. Single gallstone measuring 1.2 cm. No changes of acute cholecystitis. Electronically signed by: Rogelia Myers MD 08/15/2024 04:46 PM EST RP Workstation: [id number]   CT Angio Chest PE W and/or Wo Contrast Result Date: 08/15/2024 CLINICAL DATA:  Right-sided pleuritic chest pain EXAM: CT ANGIOGRAPHY CHEST WITH CONTRAST TECHNIQUE: Multidetector CT imaging of the chest was performed using the standard protocol during bolus administration of intravenous contrast. Multiplanar CT image reconstructions and MIPs were obtained to evaluate the vascular anatomy. RADIATION DOSE REDUCTION: This exam was performed according to the departmental dose-optimization program which includes automated exposure control, adjustment of the mA and/or kV according to patient size and/or use of iterative reconstruction technique. CONTRAST:  OMNIPAQUE  IOHEXOL  350 MG/ML SOLN COMPARISON:  Same day chest radiograph FINDINGS: Cardiovascular: The study is high quality for the evaluation of pulmonary embolism. There are no filling defects in the central, lobar, segmental or subsegmental pulmonary artery branches to suggest acute pulmonary embolism. Great vessels are normal in course and caliber. Normal heart size. No significant pericardial fluid/thickening. Mediastinum/Nodes: Imaged thyroid  gland without nodules meeting criteria for imaging follow-up by size. Normal esophagus. No pathologically enlarged axillary,  supraclavicular, mediastinal, or hilar lymph nodes. Lungs/Pleura: The central airways are patent. No focal consolidation. No pneumothorax. No pleural effusion. Upper abdomen: Please see separately dictated CT abdomen and pelvis report for detailed findings. Musculoskeletal: No acute or abnormal lytic or blastic osseous lesions. Chronic concavity of the anterior right chest wall. Review of the MIP images confirms the above findings. IMPRESSION: No acute pulmonary embolism. Electronically Signed   By: Limin  Xu  M.D.   On: 08/15/2024 14:57   CT ABDOMEN PELVIS W CONTRAST Result Date: 08/15/2024 EXAM: CT ABDOMEN AND PELVIS WITH CONTRAST 08/15/2024 02:15:05 PM TECHNIQUE: CT of the abdomen and pelvis was performed with the administration of 100 mL of iohexol  (OMNIPAQUE ) 350 MG/ML injection. Multiplanar reformatted images are provided for review. Automated exposure control, iterative reconstruction, and/or weight-based adjustment of the mA/kV was utilized to reduce the radiation dose to as low as reasonably achievable. COMPARISON: Ultrasound pelvis 1:5:26 CLINICAL HISTORY: LLQ abdominal pain. FINDINGS: LOWER CHEST: Please see separately dictated CT angiography chest 08/15/2024. LIVER: The liver is enlarged, measuring up to 20 cm. GALLBLADDER AND BILE DUCTS: Calcified stone within the gallbladder lumen. No gallbladder wall thickening. No pericholecystic fluid. No biliary ductal dilatation. SPLEEN: No acute abnormality. PANCREAS: No acute abnormality. ADRENAL GLANDS: No acute abnormality. KIDNEYS, URETERS AND BLADDER: No stones in the kidneys or ureters. No hydronephrosis. No perinephric or periureteral stranding. Urinary bladder is unremarkable. GI AND BOWEL: Stomach demonstrates no acute abnormality. The appendix measures at the upper limits of normal (7 mm). No associated periappendiceal fat stranding or inflammatory changes within the right lower quadrant. There is no bowel obstruction. PERITONEUM AND RETROPERITONEUM: No ascites. No free air. VASCULATURE: Aorta is normal in caliber. LYMPH NODES: Asymmetrically prominent but non-enlarged 1.1 cm left inguinal lymph node (2.76). No lymphadenopathy. REPRODUCTIVE ORGANS: A T-shaped intrauterine device is noted within the uterus in appropriate position. The uterus is otherwise unremarkable. No adnexal mass. BONES AND SOFT TISSUES: No acute osseous abnormality. No focal soft tissue abnormality. IMPRESSION: 1. Appendix measures at the upper limits of normal (7 mm) without  periappendiceal fat stranding or inflammatory change. 2. Cholelithiasis without CT evidence of acute cholecystitis. 3. Hepatomegaly. Electronically signed by: Morgane Naveau MD 08/15/2024 02:52 PM EST RP Workstation: [id number]   US  PELVIC COMPLETE W TRANSVAGINAL AND TORSION R/O Result Date: 08/15/2024 CLINICAL DATA:  Pelvic pain. EXAM: TRANSABDOMINAL AND TRANSVAGINAL ULTRASOUND OF PELVIS DOPPLER ULTRASOUND OF OVARIES TECHNIQUE: Both transabdominal and transvaginal ultrasound examinations of the pelvis were performed. Transabdominal technique was performed for global imaging of the pelvis including uterus, ovaries, adnexal regions, and pelvic cul-de-sac. It was necessary to proceed with endovaginal exam following the transabdominal exam to visualize the uterus, endometrium, ovaries and adnexal regions. Color and duplex Doppler ultrasound was utilized to evaluate blood flow to the ovaries. COMPARISON:  None Available. FINDINGS: Uterus Measurements: 9.3 x 3.9 x 5.2 cm = volume: 97.5 mL. No fibroids or other mass visualized. Endometrium Thickness: 7 mm, within normal limits. Intrauterine contraceptive device is appropriately positioned. Right ovary Measurements: 3.1 x 1.6 x 2.3 cm = volume:  6.0 mL. Normal appearance/no adnexal mass. Doppler: There is normal vascularity on color doppler examination. Spectral doppler arterial and venous waveforms are normal. Left ovary Measurements: 2.2 x 1.6 x 1.5 cm = volume: 2.8 mL. Normal appearance/no adnexal mass. Doppler: There is normal vascularity on color doppler examination. Spectral doppler arterial and venous waveforms are normal. Other findings No abnormal free fluid. IMPRESSION: 1. Normal exam. 2. Intrauterine contraceptive device in place. Electronically Signed  By: Newell Eke M.D.   On: 08/15/2024 14:29   DG Chest 2 View Result Date: 08/15/2024 CLINICAL DATA:  Right-sided pleuritic chest pain.  Epigastric pain. EXAM: CHEST - 2 VIEW COMPARISON:  09/20/2019  FINDINGS: The heart size and mediastinal contours are within normal limits. Both lungs are clear. No evidence of pneumothorax or pleural effusion. The visualized skeletal structures are unremarkable. IMPRESSION: No active cardiopulmonary disease. Electronically Signed   By: Norleen DELENA Kil M.D.   On: 08/15/2024 09:45        Scheduled Meds:  doxycycline   100 mg Oral BID   enoxaparin  (LOVENOX ) injection  40 mg Subcutaneous Q24H   metroNIDAZOLE   500 mg Oral Q12H   Continuous Infusions:  cefTRIAXone  (ROCEPHIN )  IV       LOS: 0 days    Time spent: 35 MInutes    Tiye Huwe A Chaunda Vandergriff, MD [facility]   If 7PM-7AM, please contact night-coverage [email or web address]  08/17/2024, 8:08 AM   "

## 2024-08-17 NOTE — Care Management Obs Status (Signed)
 MEDICARE OBSERVATION STATUS NOTIFICATION   Patient Details  Name: Erin Pratt MRN: [id number] Date of Birth: 09/05/1990   Medicare Observation Status Notification Given:  Yes Obs notice signed and copy given    Claretta Deed 08/17/2024, 11:52 AM

## 2024-08-18 DIAGNOSIS — R109 Unspecified abdominal pain: Secondary | ICD-10-CM | POA: Diagnosis not present

## 2024-08-18 LAB — CBC
HCT: 31 % — ABNORMAL LOW (ref 36.0–46.0)
Hemoglobin: 10.9 g/dL — ABNORMAL LOW (ref 12.0–15.0)
MCH: 32 pg (ref 26.0–34.0)
MCHC: 35.2 g/dL (ref 30.0–36.0)
MCV: 90.9 fL (ref 80.0–100.0)
Platelets: 116 K/uL — ABNORMAL LOW (ref 150–400)
RBC: 3.41 MIL/uL — ABNORMAL LOW (ref 3.87–5.11)
RDW: 11.8 % (ref 11.5–15.5)
WBC: 3.1 K/uL — ABNORMAL LOW (ref 4.0–10.5)
nRBC: 0 % (ref 0.0–0.2)

## 2024-08-18 LAB — IRON AND TIBC
Iron: 18 ug/dL — ABNORMAL LOW (ref 28–170)
Saturation Ratios: 7 % — ABNORMAL LOW (ref 10.4–31.8)
TIBC: 255 ug/dL (ref 250–450)
UIBC: 237 ug/dL

## 2024-08-18 LAB — BASIC METABOLIC PANEL WITH GFR
Anion gap: 5 (ref 5–15)
BUN: <5 mg/dL — ABNORMAL LOW (ref 6–20)
CO2: 28 mmol/L (ref 22–32)
Calcium: 8.2 mg/dL — ABNORMAL LOW (ref 8.9–10.3)
Chloride: 104 mmol/L (ref 98–111)
Creatinine, Ser: 0.72 mg/dL (ref 0.44–1.00)
GFR, Estimated: >60 mL/min
Glucose, Bld: 101 mg/dL — ABNORMAL HIGH (ref 70–99)
Potassium: 4 mmol/L (ref 3.5–5.1)
Sodium: 137 mmol/L (ref 135–145)

## 2024-08-18 LAB — FOLATE: Folate: <3 ng/mL — ABNORMAL LOW

## 2024-08-18 LAB — FERRITIN: Ferritin: 375 ng/mL — ABNORMAL HIGH (ref 11–307)

## 2024-08-18 LAB — VITAMIN B12: Vitamin B-12: 278 pg/mL (ref 180–914)

## 2024-08-18 MED ORDER — FOLIC ACID 1 MG PO TABS
1.0000 mg | ORAL_TABLET | Freq: Every day | ORAL | Status: DC
  Administered 2024-08-18 – 2024-08-19 (×2): 1 mg via ORAL
  Filled 2024-08-18 (×2): qty 1

## 2024-08-18 MED ORDER — LACTATED RINGERS IV BOLUS
500.0000 mL | Freq: Once | INTRAVENOUS | Status: AC
  Administered 2024-08-18: 500 mL via INTRAVENOUS

## 2024-08-18 MED ORDER — PHENAZOPYRIDINE HCL 100 MG PO TABS
100.0000 mg | ORAL_TABLET | Freq: Three times a day (TID) | ORAL | Status: DC | PRN
  Administered 2024-08-18 (×2): 100 mg via ORAL
  Filled 2024-08-18 (×3): qty 1

## 2024-08-18 MED ORDER — VITAMIN B-12 1000 MCG PO TABS
500.0000 ug | ORAL_TABLET | Freq: Every day | ORAL | Status: DC
  Administered 2024-08-18 – 2024-08-19 (×2): 500 ug via ORAL
  Filled 2024-08-18: qty 0.5
  Filled 2024-08-18: qty 1

## 2024-08-18 NOTE — Plan of Care (Signed)

## 2024-08-18 NOTE — Progress Notes (Signed)
 Transition of Care [facility]) - Inpatient Brief Assessment   Patient Details  Name: Erin Pratt MRN: [id number] Date of Birth: Nov 29, 1990  [facility] CM/SW Contact:    Rosaline JONELLE Joe, RN Phone Number: 08/18/2024, 2:40 PM   Clinical Narrative: Patient admitted to the hospital from home with Intractable abdominal pain, UTI Questionable PID.  No IP Care management needs at this time.  Patient should discharge back home when stable for discharge.  PCP - Patient has listed PCP for hospital follow up.   Transition of Care Asessment: Insurance and Status: (P) Insurance coverage has been reviewed Patient has primary care physician: (P) Yes Home environment has been reviewed: (P) from home Prior level of function:: (P) self Prior/Current Home Services: (P) No current home services Social Drivers of Health Review: (P) SDOH reviewed no interventions necessary Readmission risk has been reviewed: (P) Yes Transition of care needs: (P) no transition of care needs at this time

## 2024-08-18 NOTE — Progress Notes (Signed)
 " PROGRESS NOTE    Erin Pratt  FMW:[id number] DOB: 1991/01/30 DOA: 08/16/2024 PCP: Nedra Tinnie LABOR, NP   Brief Narrative: 34 year old with past medical history significant for asthma, anxiety, presenting with fatigue, abdominal pain and dysuria.  She was evaluated in the ED 1/5, had extensive workup including negative CTA chest, cholelithiasis without acute cholecystitis, normal pelvic ultrasound and negative STI testing.  Started on empiric treatment for PID returned complaining of severe abdominal pain.  Reports having fever 1/4.  Evaluation in the ED UA positive for bacteriuria, pyuria microscopic hematuria no acute finding on CT abdomen and pelvis.  Ultrasound revealed single gallstone.   Assessment & Plan:   Principal Problem:   Intractable abdominal pain Active Problems:   Mild intermittent asthma without complication   Acute pyelonephritis   1-Intractable abdominal pain, UTI Questionable PID - Patient presents initially January 5 complaining of abdominal pain, dysuria, was initially more right side, CT chest was negative for PE only showed cholelithiasis no cholecystitis. - Presented with uncontrolled pain, poor oral intake. - Patient with positive CVA, dysuria, UA with 21-50 white blood cell. - Continue Doxy, Flagyl , IV ceftriaxone  to cover for PID - She reported some vaginal discharge and also some small bump in her labia area. - She did had chlamydia gonorrhea test performed 2 days ago which was negative. - Continue with vaginal clotrimazole  Urine culture no growth but she was on antibiotics prior to admission.  Flank pain improving. Still having dysuria. PRN pyridium  ordered.  Plan to continue with IV fluids.   Anemia; could be hemodilution and  or nutritional deficiency.  B 12, folic acid  and ferritin and iron level check.  Low normal b 12; start supplement.   Folic acid  deficiency: start folic acid  supplement.  Thrombocytopenia; suspect related to nutrition  deficiency and acute infectious process.   Hemorrhoids: She reports history of hemorrhoid and having a flare currently.  Continue with Hydrocortisone  ordered  Asthma: As needed albuterol   Hypokalemia: Replaced.   Cholelithiasis, no right upper quadrant pain, normal liver function test. Symptoms probably more consistent with UTI  Estimated body mass index is 25.06 kg/m as calculated from the following:   Height as of this encounter: 5' 7 (1.702 m).   Weight as of this encounter: 72.6 kg.   DVT prophylaxis: Lovenox  Code Status: Full code Family Communication: Husband who was at bedside Disposition Plan:  Status is: Observation The patient remains OBS appropriate and will d/c before 2 midnights.    Consultants:  None  Procedures:  none  Antimicrobials:    Subjective: Report improvement of right side flank pain dysuria persist.  Hemorrhoid not bleeding no worsening pain.  Feels better.   Objective: Vitals:   08/17/24 1631 08/17/24 2056 08/17/24 2337 08/18/24 0409  BP: 95/64 100/62 92/62 96/60   Pulse: 77 73 76 80  Resp: 14 18 18 18   Temp:  97.6 F (36.4 C) 98 F (36.7 C) 98.2 F (36.8 C)  TempSrc:      SpO2: 99% 97% 100% 100%  Weight:      Height:        Intake/Output Summary (Last 24 hours) at 08/18/2024 0716 Last data filed at 08/18/2024 0000 Gross per 24 hour  Intake 980 ml  Output 1800 ml  Net -820 ml   Filed Weights   08/16/24 0803  Weight: 72.6 kg    Examination:  General exam:  NAD Respiratory system: CTA Cardiovascular system: S 1, S 2RRR Gastrointestinal system: BS present, soft, nt Central nervous  system: Alert Extremities: no edema    Data Reviewed: I have personally reviewed following labs and imaging studies  CBC: Recent Labs  Lab 08/15/24 0853 08/16/24 0838 08/17/24 0250 08/18/24 0312  WBC 5.1 5.7 3.9* 3.1*  HGB 14.6 13.9 12.2 10.9*  HCT 41.8 40.2 34.4* 31.0*  MCV 91.7 92.4 90.1 90.9  PLT 182 181 141* 116*   Basic  Metabolic Panel: Recent Labs  Lab 08/15/24 0853 08/16/24 0838 08/17/24 0250 08/18/24 0312  NA 136 139 137 137  K 3.4* 3.7 3.0* 4.0  CL 99 103 103 104  CO2 23 25 24 28   GLUCOSE 128* 79 109* 101*  BUN 8 8 7  <5*  CREATININE 0.96 0.77 0.78 0.72  CALCIUM 9.0 8.9 8.0* 8.2*   GFR: Estimated Creatinine Clearance: 97.3 mL/min (by C-G formula based on SCr of 0.72 mg/dL). Liver Function Tests: Recent Labs  Lab 08/15/24 1145 08/16/24 0838  AST 34 30  ALT 41 37  ALKPHOS 45 46  BILITOT 0.3 0.3  PROT 6.8 7.2  ALBUMIN 3.8 4.0   Recent Labs  Lab 08/15/24 0853 08/16/24 0838  LIPASE 31 26   No results for input(s): AMMONIA in the last 168 hours. Coagulation Profile: No results for input(s): INR, PROTIME in the last 168 hours. Cardiac Enzymes: No results for input(s): CKTOTAL, CKMB, CKMBINDEX, TROPONINI in the last 168 hours. BNP (last 3 results) No results for input(s): PROBNP in the last 8760 hours. HbA1C: No results for input(s): HGBA1C in the last 72 hours. CBG: No results for input(s): GLUCAP in the last 168 hours. Lipid Profile: No results for input(s): CHOL, HDL, LDLCALC, TRIG, CHOLHDL, LDLDIRECT in the last 72 hours. Thyroid  Function Tests: Recent Labs    08/15/24 1230  TSH 1.060  FREET4 1.17   Anemia Panel: No results for input(s): VITAMINB12, FOLATE, FERRITIN, TIBC, IRON, RETICCTPCT in the last 72 hours. Sepsis Labs: Recent Labs  Lab 08/16/24 0848  LATICACIDVEN 1.0    Recent Results (from the past 240 hours)  Resp panel by RT-PCR (RSV, Flu A&B, Covid) Anterior Nasal Swab     Status: None   Collection Time: 08/15/24 12:30 PM   Specimen: Anterior Nasal Swab  Result Value Ref Range Status   SARS Coronavirus 2 by RT PCR NEGATIVE NEGATIVE Final    Comment: (NOTE) SARS-CoV-2 target nucleic acids are NOT DETECTED.  The SARS-CoV-2 RNA is generally detectable in upper respiratory specimens during the acute phase of  infection. The lowest concentration of SARS-CoV-2 viral copies this assay can detect is 138 copies/mL. A negative result does not preclude SARS-Cov-2 infection and should not be used as the sole basis for treatment or other patient management decisions. A negative result may occur with  improper specimen collection/handling, submission of specimen other than nasopharyngeal swab, presence of viral mutation(s) within the areas targeted by this assay, and inadequate number of viral copies(<138 copies/mL). A negative result must be combined with clinical observations, patient history, and epidemiological information. The expected result is Negative.  Fact Sheet for Patients:  bloggercourse.com  Fact Sheet for Healthcare Providers:  seriousbroker.it  This test is no t yet approved or cleared by the [place]  FDA and  has been authorized for detection and/or diagnosis of SARS-CoV-2 by FDA under an Emergency Use Authorization (EUA). This EUA will remain  in effect (meaning this test can be used) for the duration of the COVID-19 declaration under Section 564(b)(1) of the Act, 21 U.S.C.section 360bbb-3(b)(1), unless the authorization is terminated  or revoked  sooner.       Influenza A by PCR NEGATIVE NEGATIVE Final   Influenza B by PCR NEGATIVE NEGATIVE Final    Comment: (NOTE) The Xpert Xpress SARS-CoV-2/FLU/RSV plus assay is intended as an aid in the diagnosis of influenza from Nasopharyngeal swab specimens and should not be used as a sole basis for treatment. Nasal washings and aspirates are unacceptable for Xpert Xpress SARS-CoV-2/FLU/RSV testing.  Fact Sheet for Patients: bloggercourse.com  Fact Sheet for Healthcare Providers: seriousbroker.it  This test is not yet approved or cleared by the [place]  FDA and has been authorized for detection and/or diagnosis of SARS-CoV-2  by FDA under an Emergency Use Authorization (EUA). This EUA will remain in effect (meaning this test can be used) for the duration of the COVID-19 declaration under Section 564(b)(1) of the Act, 21 U.S.C. section 360bbb-3(b)(1), unless the authorization is terminated or revoked.     Resp Syncytial Virus by PCR NEGATIVE NEGATIVE Final    Comment: (NOTE) Fact Sheet for Patients: bloggercourse.com  Fact Sheet for Healthcare Providers: seriousbroker.it  This test is not yet approved or cleared by the [place]  FDA and has been authorized for detection and/or diagnosis of SARS-CoV-2 by FDA under an Emergency Use Authorization (EUA). This EUA will remain in effect (meaning this test can be used) for the duration of the COVID-19 declaration under Section 564(b)(1) of the Act, 21 U.S.C. section 360bbb-3(b)(1), unless the authorization is terminated or revoked.  Performed at [facility], [address] [place], [place] [address]   Wet prep, genital     Status: None   Collection Time: 08/15/24 12:51 PM  Result Value Ref Range Status   Yeast Wet Prep HPF POC NONE SEEN NONE SEEN Final   Trich, Wet Prep NONE SEEN NONE SEEN Final    Comment: Specimen diluted due to transport tube containing more than 1 ml of saline, interpret results with caution.   Clue Cells Wet Prep HPF POC NONE SEEN NONE SEEN Final   WBC, Wet Prep HPF POC <10 <10 Final   Sperm NONE SEEN  Final    Comment: Performed at [facility], [address] [place], [place] [address]  Blood culture (routine x 2)     Status: None (Preliminary result)   Collection Time: 08/15/24  3:00 PM   Specimen: BLOOD  Result Value Ref Range Status   Specimen Description   Final    BLOOD RIGHT ANTECUBITAL Performed at [facility], [address] [place], [place] [address]    Special Requests   Final    BOTTLES DRAWN AEROBIC  AND ANAEROBIC Blood Culture results may not be optimal due to an excessive volume of blood received in culture bottles Performed at [facility], [address] [place], [place] [address]    Culture   Final    NO GROWTH 2 DAYS Performed at [facility], [address], [place], [place] [address]    Report Status PENDING  Incomplete  Urine Culture     Status: None   Collection Time: 08/16/24  6:17 PM   Specimen: Urine, Clean Catch  Result Value Ref Range Status   Specimen Description URINE, CLEAN CATCH  Final   Special Requests Normal  Final   Culture   Final    NO GROWTH Performed at [facility], [address] [place], [place] [address]    Report Status 08/17/2024 FINAL  Final  Radiology Studies: CT Renal Stone Study Result Date: 08/16/2024 EXAM: CT ABDOMEN AND PELVIS WITHOUT CONTRAST 08/16/2024 07:20:59 PM TECHNIQUE: CT of the abdomen and pelvis was performed without the administration of intravenous contrast. Multiplanar reformatted images are provided for review. Automated exposure control, iterative reconstruction, and/or weight-based adjustment of the mA/kV was utilized to reduce the radiation dose to as low as reasonably achievable. COMPARISON: CT abdomen and pelvis 08/15/2024. CLINICAL HISTORY: Abdominal/flank pain, stone suspected. FINDINGS: LOWER CHEST: No acute abnormality. LIVER: The liver is unremarkable. GALLBLADDER AND BILE DUCTS: Gallstones and excreted contrast in the gallbladder. No biliary ductal dilatation. SPLEEN: No acute abnormality. PANCREAS: No acute abnormality. ADRENAL GLANDS: No acute abnormality. KIDNEYS, URETERS AND BLADDER: No stones in the kidneys or ureters. No hydronephrosis. No perinephric or periureteral stranding. There is a small amount of hyperdensity throughout the bladder likely related to residual contrast from yesterday. GI AND BOWEL: Stomach demonstrates no acute abnormality. The appendix appears within  normal limits. There is no bowel obstruction. PERITONEUM AND RETROPERITONEUM: There is trace free fluid in the pelvis. No free air. VASCULATURE: Aorta is normal in caliber. LYMPH NODES: Enlarged left inguinal lymph node measures 12 mm, unchanged. REPRODUCTIVE ORGANS: There is an IUD in the uterus. BONES AND SOFT TISSUES: No acute osseous abnormality. No focal soft tissue abnormality. IMPRESSION: 1. No acute findings in the abdomen or pelvis. 2. Cholelithiasis. 3. Enlarged left inguinal lymph node measuring 12 mm, unchanged. Electronically signed by: Greig Pique MD 08/16/2024 07:32 PM EST RP Workstation: [id number]   US  Abdomen Limited RUQ (LIVER/GB) Result Date: 08/16/2024 EXAM: Right Upper Quadrant Abdominal Ultrasound 08/16/2024 09:50:26 AM TECHNIQUE: Real-time ultrasonography of the right upper quadrant of the abdomen was performed. COMPARISON: 08/15/2024 CLINICAL HISTORY: RUQ abdominal pain. FINDINGS: LIVER: Normal echogenicity. No intrahepatic biliary ductal dilatation. No evidence of mass. Hepatopetal flow in the portal vein. BILIARY SYSTEM: Single mobile gallstone noted. No pericholecystic fluid or wall thickening. While no sonographic Beverley sign was appreciated, the patient had received pain medication prior to the exam. Common bile duct is within normal limits measuring 1.6 mm. RIGHT KIDNEY: No hydronephrosis. No echogenic calculi. No mass. PANCREAS: Visualized portions of the pancreas are unremarkable. OTHER: No right upper quadrant ascites. IMPRESSION: 1. Single mobile gallstone. No sonographic changes to suggest acute cholecystitis. Electronically signed by: Rogelia Myers MD 08/16/2024 10:13 AM EST RP Workstation: [id number]        Scheduled Meds:  clotrimazole   1 Applicatorful Vaginal QHS   doxycycline   100 mg Oral BID   enoxaparin  (LOVENOX ) injection  40 mg Subcutaneous Q24H   hydrocortisone  cream   Topical BID   metroNIDAZOLE   500 mg Oral Q12H   pantoprazole   40 mg Oral Daily    polyethylene glycol  17 g Oral BID   Continuous Infusions:  cefTRIAXone  (ROCEPHIN )  IV Stopped (08/17/24 1822)   lactated ringers  100 mL/hr at 08/17/24 1822     LOS: 1 day    Time spent: 35 MInutes    Jolynn Bajorek A Tiawana Forgy, MD [facility]   If 7PM-7AM, please contact night-coverage [email or web address]  08/18/2024, 7:16 AM   "

## 2024-08-19 ENCOUNTER — Other Ambulatory Visit (HOSPITAL_COMMUNITY): Payer: Self-pay

## 2024-08-19 DIAGNOSIS — R109 Unspecified abdominal pain: Secondary | ICD-10-CM | POA: Diagnosis not present

## 2024-08-19 LAB — CBC
HCT: 32 % — ABNORMAL LOW (ref 36.0–46.0)
Hemoglobin: 11.4 g/dL — ABNORMAL LOW (ref 12.0–15.0)
MCH: 32.6 pg (ref 26.0–34.0)
MCHC: 35.6 g/dL (ref 30.0–36.0)
MCV: 91.4 fL (ref 80.0–100.0)
Platelets: 144 K/uL — ABNORMAL LOW (ref 150–400)
RBC: 3.5 MIL/uL — ABNORMAL LOW (ref 3.87–5.11)
RDW: 11.8 % (ref 11.5–15.5)
WBC: 6.4 K/uL (ref 4.0–10.5)
nRBC: 0 % (ref 0.0–0.2)

## 2024-08-19 MED ORDER — FERROUS SULFATE 325 (65 FE) MG PO TBEC: 325.0000 mg | DELAYED_RELEASE_TABLET | Freq: Every day | ORAL | 1 refills | Status: AC

## 2024-08-19 MED ORDER — CYANOCOBALAMIN 1000 MCG PO TABS
500.0000 ug | ORAL_TABLET | Freq: Every day | ORAL | 0 refills | Status: AC
  Filled 2024-08-19: qty 15, 30d supply, fill #0

## 2024-08-19 MED ORDER — HYDROCODONE-ACETAMINOPHEN 5-325 MG PO TABS
1.0000 | ORAL_TABLET | ORAL | 0 refills | Status: DC | PRN
  Filled 2024-08-19: qty 30, 7d supply, fill #0

## 2024-08-19 MED ORDER — CLOTRIMAZOLE 1 % VA CREA
1.0000 | TOPICAL_CREAM | Freq: Every day | VAGINAL | 0 refills | Status: DC
  Filled 2024-08-19: qty 45, 5d supply, fill #0

## 2024-08-19 MED ORDER — CEFPODOXIME PROXETIL 200 MG PO TABS
200.0000 mg | ORAL_TABLET | Freq: Two times a day (BID) | ORAL | 0 refills | Status: AC
  Filled 2024-08-19: qty 24, 12d supply, fill #0

## 2024-08-19 MED ORDER — DOXYCYCLINE HYCLATE 100 MG PO TABS
100.0000 mg | ORAL_TABLET | Freq: Two times a day (BID) | ORAL | 0 refills | Status: AC
  Filled 2024-08-19: qty 24, 12d supply, fill #0

## 2024-08-19 MED ORDER — METRONIDAZOLE 500 MG PO TABS
500.0000 mg | ORAL_TABLET | Freq: Two times a day (BID) | ORAL | 0 refills | Status: AC
  Filled 2024-08-19: qty 24, 12d supply, fill #0

## 2024-08-19 MED ORDER — POLYETHYLENE GLYCOL 3350 17 GM/SCOOP PO POWD
17.0000 g | Freq: Two times a day (BID) | ORAL | 0 refills | Status: AC
  Filled 2024-08-19: qty 238, 7d supply, fill #0

## 2024-08-19 MED ORDER — FOLIC ACID 1 MG PO TABS
1.0000 mg | ORAL_TABLET | Freq: Every day | ORAL | 0 refills | Status: AC
  Filled 2024-08-19: qty 30, 30d supply, fill #0

## 2024-08-19 NOTE — Discharge Summary (Signed)
 " Physician Discharge Summary   Patient: Erin Pratt MRN: [id number] DOB: [birth year]  Admit date:     08/16/2024  Discharge date: 08/19/2024  Discharge Physician: Owen DELENA Lore   PCP: Nedra Tinnie DELENA, NP   Recommendations at discharge:    Needs Repeat folic acid  , B 12 level in 6 weeks.  Needs follow up resolution of Pyelonephritis, PID.  Needs CBC to follow up Hb, platelet.  Follow up with PCP for evaluation iron deficiency and Hemorrhoids. Might need GI referral.   Discharge Diagnoses: Principal Problem:   Intractable abdominal pain Active Problems:   Mild intermittent asthma without complication   Acute pyelonephritis  Resolved Problems:   * No resolved hospital problems. *  Hospital Course: 34 year old with past medical history significant for asthma, anxiety, presenting with fatigue, abdominal pain and dysuria.  She was evaluated in the ED 1/5, had extensive workup including negative CTA chest, cholelithiasis without acute cholecystitis, normal pelvic ultrasound and negative STI testing.  Started on empiric treatment for PID returned complaining of severe abdominal pain.  Reports having fever 1/4.   Evaluation in the ED UA positive for bacteriuria, pyuria microscopic hematuria no acute finding on CT abdomen and pelvis.  Ultrasound revealed single gallstone.    Assessment and Plan: 1-Intractable abdominal pain, UTI, Pyelonephritis.  Possible PID - Patient presents initially January 5 complaining of abdominal pain, dysuria, was initially more right side, CT chest was negative for PE only showed cholelithiasis no cholecystitis. - Presented with uncontrolled pain, poor oral intake. - Patient with positive CVA, dysuria, UA with 21-50 white blood cell. - Continue Doxy, Flagyl , IV ceftriaxone  to cover for PID - She reported some vaginal discharge and also some small bump in her labia area. - She did had chlamydia gonorrhea test performed 2 days ago which was negative. -  Continue with vaginal clotrimazole  Urine culture no growth but she was on antibiotics prior to admission.  Flank pain improving, dysuria improving. Feels better. She couldn't tolerates pyridium  develop headaches after.  Discharge on doxy, flagyl , and cefpodoxime  for 12 days to complete 14 days treatment.    Anemia; could be hemodilution and  or nutritional deficiency. Also Iron deficiency anemia, folic acid  deficiency anemia. Folic acid  low. Iron deficiency ; started on supplement.  Low normal b 12; started  supplement.    Folic acid  deficiency: started folic acid  supplement.  Thrombocytopenia; suspect related to nutrition deficiency and acute infectious process.   improved.   Hemorrhoids: She reports history of hemorrhoid and having a flare currently.  Continue with Hydrocortisone  ordered   Asthma: As needed albuterol    Hypokalemia: Replaced.    Cholelithiasis, no right upper quadrant pain, normal liver function test. Symptoms probably more consistent with UTI          Consultants: None Procedures performed: none Disposition: Home Diet recommendation:  Regular diet DISCHARGE MEDICATION: Allergies as of 08/19/2024       Reactions   Junel 1-20 [norethindrone Acet-ethinyl Est]    Other    Seasonal allergies        Medication List     STOP taking these medications    doxycycline  100 MG capsule Commonly known as: VIBRAMYCIN  Replaced by: doxycycline  100 MG tablet   naproxen sodium 220 MG tablet Commonly known as: ALEVE       TAKE these medications    acetaminophen  500 MG tablet Commonly known as: TYLENOL  Take 500 mg by mouth every 6 (six) hours as needed for mild pain (pain score 1-3)  or moderate pain (pain score 4-6).   albuterol  108 (90 Base) MCG/ACT inhaler Commonly known as: VENTOLIN  HFA INHALE 2 PUFFS INTO THE LUNGS EVERY 4 (FOUR) HOURS AS NEEDED FOR WHEEZING OR SHORTNESS OF BREATH.   cefpodoxime  200 MG tablet Commonly known as: VANTIN  Take 1  tablet (200 mg total) by mouth 2 (two) times daily for 12 days.   clotrimazole  1 % vaginal cream Commonly known as: GYNE-LOTRIMIN  Place 1 Applicatorful vaginally at bedtime for 5 days.   Cryselle-28 0.3-30 MG-MCG tablet Generic drug: norgestrel-ethinyl estradiol  Take 1 tablet by mouth daily.   cyanocobalamin  1000 MCG tablet Commonly known as: VITAMIN B12 Take 0.5 tablets (500 mcg total) by mouth daily. Start taking on: August 20, 2024   doxycycline  100 MG tablet Commonly known as: VIBRA -TABS Take 1 tablet (100 mg total) by mouth 2 (two) times daily for 12 days. Replaces: doxycycline  100 MG capsule   ferrous sulfate  325 (65 FE) MG EC tablet Take 1 tablet (325 mg total) by mouth daily with breakfast.   folic acid  1 MG tablet Commonly known as: FOLVITE  Take 1 tablet (1 mg total) by mouth daily. Start taking on: August 20, 2024   HYDROcodone -acetaminophen  5-325 MG tablet Commonly known as: NORCO/VICODIN Take 1-2 tablets by mouth every 4 (four) hours as needed for up to 3 days for moderate pain (pain score 4-6).   hydrOXYzine  25 MG capsule Commonly known as: VISTARIL  Take 1 capsule (25 mg total) by mouth every 8 (eight) hours as needed.   ibuprofen  200 MG tablet Commonly known as: ADVIL  Take 2 tablets by mouth as needed for mild pain (pain score 1-3) or moderate pain (pain score 4-6).   metroNIDAZOLE  500 MG tablet Commonly known as: FLAGYL  Take 1 tablet (500 mg total) by mouth every 12 (twelve) hours for 12 days.   Mirena (52 MG) 20 MCG/DAY Iud Generic drug: levonorgestrel 1 each by Intrauterine route once.   polyethylene glycol powder 17 GM/SCOOP powder Commonly known as: GLYCOLAX /MIRALAX  Dissolve 1 capful (17g) in 4-8 ounces of liquid and take by mouth 2 (two) times daily.        Follow-up Information     McElwee, Lauren A, NP Follow up.   Specialty: Internal Medicine Why: Call the clinic and schedule a hospital follow up in the next 7-10 days. Contact  information: [address] [place] [address] [phone]                Discharge Exam: Fredricka Weights   08/16/24 0803  Weight: 72.6 kg   General; NAD  Condition at discharge: stable  The results of significant diagnostics from this hospitalization (including imaging, microbiology, ancillary and laboratory) are listed below for reference.   Imaging Studies: CT Renal Stone Study Result Date: 08/16/2024 EXAM: CT ABDOMEN AND PELVIS WITHOUT CONTRAST 08/16/2024 07:20:59 PM TECHNIQUE: CT of the abdomen and pelvis was performed without the administration of intravenous contrast. Multiplanar reformatted images are provided for review. Automated exposure control, iterative reconstruction, and/or weight-based adjustment of the mA/kV was utilized to reduce the radiation dose to as low as reasonably achievable. COMPARISON: CT abdomen and pelvis 08/15/2024. CLINICAL HISTORY: Abdominal/flank pain, stone suspected. FINDINGS: LOWER CHEST: No acute abnormality. LIVER: The liver is unremarkable. GALLBLADDER AND BILE DUCTS: Gallstones and excreted contrast in the gallbladder. No biliary ductal dilatation. SPLEEN: No acute abnormality. PANCREAS: No acute abnormality. ADRENAL GLANDS: No acute abnormality. KIDNEYS, URETERS AND BLADDER: No stones in the kidneys or ureters. No hydronephrosis. No perinephric or periureteral stranding.  There is a small amount of hyperdensity throughout the bladder likely related to residual contrast from yesterday. GI AND BOWEL: Stomach demonstrates no acute abnormality. The appendix appears within normal limits. There is no bowel obstruction. PERITONEUM AND RETROPERITONEUM: There is trace free fluid in the pelvis. No free air. VASCULATURE: Aorta is normal in caliber. LYMPH NODES: Enlarged left inguinal lymph node measures 12 mm, unchanged. REPRODUCTIVE ORGANS: There is an IUD in the uterus. BONES AND SOFT TISSUES: No acute osseous abnormality. No focal soft tissue  abnormality. IMPRESSION: 1. No acute findings in the abdomen or pelvis. 2. Cholelithiasis. 3. Enlarged left inguinal lymph node measuring 12 mm, unchanged. Electronically signed by: Greig Pique MD 08/16/2024 07:32 PM EST RP Workstation: [id number]   US  Abdomen Limited RUQ (LIVER/GB) Result Date: 08/16/2024 EXAM: Right Upper Quadrant Abdominal Ultrasound 08/16/2024 09:50:26 AM TECHNIQUE: Real-time ultrasonography of the right upper quadrant of the abdomen was performed. COMPARISON: 08/15/2024 CLINICAL HISTORY: RUQ abdominal pain. FINDINGS: LIVER: Normal echogenicity. No intrahepatic biliary ductal dilatation. No evidence of mass. Hepatopetal flow in the portal vein. BILIARY SYSTEM: Single mobile gallstone noted. No pericholecystic fluid or wall thickening. While no sonographic Beverley sign was appreciated, the patient had received pain medication prior to the exam. Common bile duct is within normal limits measuring 1.6 mm. RIGHT KIDNEY: No hydronephrosis. No echogenic calculi. No mass. PANCREAS: Visualized portions of the pancreas are unremarkable. OTHER: No right upper quadrant ascites. IMPRESSION: 1. Single mobile gallstone. No sonographic changes to suggest acute cholecystitis. Electronically signed by: Rogelia Myers MD 08/16/2024 10:13 AM EST RP Workstation: [id number]   US  Abdomen Limited RUQ (LIVER/GB) Result Date: 08/15/2024 EXAM: Right Upper Quadrant Abdominal Ultrasound 08/15/2024 04:24:46 PM TECHNIQUE: Real-time ultrasonography of the right upper quadrant of the abdomen was performed. COMPARISON: CT of the abdomen and pelvis dated 08/15/2024. CLINICAL HISTORY: Cholelithiasis. FINDINGS: LIVER: Normal echogenicity. No intrahepatic biliary ductal dilatation. No evidence of mass. Hepatopetal flow in the portal vein. BILIARY SYSTEM: Gallbladder wall thickness measures 2.5 mm, remeasured at the radiologist workstation. Single gallstone measuring 1.2 cm. No pericholecystic fluid. Negative sonographic Murphy  sign. The common bile duct measures 3.1 mm. OTHER: No right upper quadrant ascites. IMPRESSION: 1. Single gallstone measuring 1.2 cm. No changes of acute cholecystitis. Electronically signed by: Rogelia Myers MD 08/15/2024 04:46 PM EST RP Workstation: [id number]   CT Angio Chest PE W and/or Wo Contrast Result Date: 08/15/2024 CLINICAL DATA:  Right-sided pleuritic chest pain EXAM: CT ANGIOGRAPHY CHEST WITH CONTRAST TECHNIQUE: Multidetector CT imaging of the chest was performed using the standard protocol during bolus administration of intravenous contrast. Multiplanar CT image reconstructions and MIPs were obtained to evaluate the vascular anatomy. RADIATION DOSE REDUCTION: This exam was performed according to the departmental dose-optimization program which includes automated exposure control, adjustment of the mA and/or kV according to patient size and/or use of iterative reconstruction technique. CONTRAST:  OMNIPAQUE  IOHEXOL  350 MG/ML SOLN COMPARISON:  Same day chest radiograph FINDINGS: Cardiovascular: The study is high quality for the evaluation of pulmonary embolism. There are no filling defects in the central, lobar, segmental or subsegmental pulmonary artery branches to suggest acute pulmonary embolism. Great vessels are normal in course and caliber. Normal heart size. No significant pericardial fluid/thickening. Mediastinum/Nodes: Imaged thyroid  gland without nodules meeting criteria for imaging follow-up by size. Normal esophagus. No pathologically enlarged axillary, supraclavicular, mediastinal, or hilar lymph nodes. Lungs/Pleura: The central airways are patent. No focal consolidation. No pneumothorax. No pleural effusion. Upper abdomen: Please see separately dictated CT abdomen and pelvis  report for detailed findings. Musculoskeletal: No acute or abnormal lytic or blastic osseous lesions. Chronic concavity of the anterior right chest wall. Review of the MIP images confirms the above findings.  IMPRESSION: No acute pulmonary embolism. Electronically Signed   By: Limin  Xu M.D.   On: 08/15/2024 14:57   CT ABDOMEN PELVIS W CONTRAST Result Date: 08/15/2024 EXAM: CT ABDOMEN AND PELVIS WITH CONTRAST 08/15/2024 02:15:05 PM TECHNIQUE: CT of the abdomen and pelvis was performed with the administration of 100 mL of iohexol  (OMNIPAQUE ) 350 MG/ML injection. Multiplanar reformatted images are provided for review. Automated exposure control, iterative reconstruction, and/or weight-based adjustment of the mA/kV was utilized to reduce the radiation dose to as low as reasonably achievable. COMPARISON: Ultrasound pelvis 1:5:26 CLINICAL HISTORY: LLQ abdominal pain. FINDINGS: LOWER CHEST: Please see separately dictated CT angiography chest 08/15/2024. LIVER: The liver is enlarged, measuring up to 20 cm. GALLBLADDER AND BILE DUCTS: Calcified stone within the gallbladder lumen. No gallbladder wall thickening. No pericholecystic fluid. No biliary ductal dilatation. SPLEEN: No acute abnormality. PANCREAS: No acute abnormality. ADRENAL GLANDS: No acute abnormality. KIDNEYS, URETERS AND BLADDER: No stones in the kidneys or ureters. No hydronephrosis. No perinephric or periureteral stranding. Urinary bladder is unremarkable. GI AND BOWEL: Stomach demonstrates no acute abnormality. The appendix measures at the upper limits of normal (7 mm). No associated periappendiceal fat stranding or inflammatory changes within the right lower quadrant. There is no bowel obstruction. PERITONEUM AND RETROPERITONEUM: No ascites. No free air. VASCULATURE: Aorta is normal in caliber. LYMPH NODES: Asymmetrically prominent but non-enlarged 1.1 cm left inguinal lymph node (2.76). No lymphadenopathy. REPRODUCTIVE ORGANS: A T-shaped intrauterine device is noted within the uterus in appropriate position. The uterus is otherwise unremarkable. No adnexal mass. BONES AND SOFT TISSUES: No acute osseous abnormality. No focal soft tissue abnormality.  IMPRESSION: 1. Appendix measures at the upper limits of normal (7 mm) without periappendiceal fat stranding or inflammatory change. 2. Cholelithiasis without CT evidence of acute cholecystitis. 3. Hepatomegaly. Electronically signed by: Morgane Naveau MD 08/15/2024 02:52 PM EST RP Workstation: [id number]   US  PELVIC COMPLETE W TRANSVAGINAL AND TORSION R/O Result Date: 08/15/2024 CLINICAL DATA:  Pelvic pain. EXAM: TRANSABDOMINAL AND TRANSVAGINAL ULTRASOUND OF PELVIS DOPPLER ULTRASOUND OF OVARIES TECHNIQUE: Both transabdominal and transvaginal ultrasound examinations of the pelvis were performed. Transabdominal technique was performed for global imaging of the pelvis including uterus, ovaries, adnexal regions, and pelvic cul-de-sac. It was necessary to proceed with endovaginal exam following the transabdominal exam to visualize the uterus, endometrium, ovaries and adnexal regions. Color and duplex Doppler ultrasound was utilized to evaluate blood flow to the ovaries. COMPARISON:  None Available. FINDINGS: Uterus Measurements: 9.3 x 3.9 x 5.2 cm = volume: 97.5 mL. No fibroids or other mass visualized. Endometrium Thickness: 7 mm, within normal limits. Intrauterine contraceptive device is appropriately positioned. Right ovary Measurements: 3.1 x 1.6 x 2.3 cm = volume:  6.0 mL. Normal appearance/no adnexal mass. Doppler: There is normal vascularity on color doppler examination. Spectral doppler arterial and venous waveforms are normal. Left ovary Measurements: 2.2 x 1.6 x 1.5 cm = volume: 2.8 mL. Normal appearance/no adnexal mass. Doppler: There is normal vascularity on color doppler examination. Spectral doppler arterial and venous waveforms are normal. Other findings No abnormal free fluid. IMPRESSION: 1. Normal exam. 2. Intrauterine contraceptive device in place. Electronically Signed   By: Newell Eke M.D.   On: 08/15/2024 14:29   DG Chest 2 View Result Date: 08/15/2024 CLINICAL DATA:  Right-sided pleuritic  chest pain.  Epigastric pain. EXAM: CHEST - 2 VIEW COMPARISON:  09/20/2019 FINDINGS: The heart size and mediastinal contours are within normal limits. Both lungs are clear. No evidence of pneumothorax or pleural effusion. The visualized skeletal structures are unremarkable. IMPRESSION: No active cardiopulmonary disease. Electronically Signed   By: Norleen DELENA Kil M.D.   On: 08/15/2024 09:45    Microbiology: Results for orders placed or performed during the hospital encounter of 08/16/24  Urine Culture     Status: None   Collection Time: 08/16/24  6:17 PM   Specimen: Urine, Clean Catch  Result Value Ref Range Status   Specimen Description URINE, CLEAN CATCH  Final   Special Requests Normal  Final   Culture   Final    NO GROWTH Performed at [facility], [address] [place], [place] [address]    Report Status 08/17/2024 FINAL  Final    Labs: CBC: Recent Labs  Lab 08/15/24 0853 08/16/24 0838 08/17/24 0250 08/18/24 0312 08/19/24 0453  WBC 5.1 5.7 3.9* 3.1* 6.4  HGB 14.6 13.9 12.2 10.9* 11.4*  HCT 41.8 40.2 34.4* 31.0* 32.0*  MCV 91.7 92.4 90.1 90.9 91.4  PLT 182 181 141* 116* 144*   Basic Metabolic Panel: Recent Labs  Lab 08/15/24 0853 08/16/24 0838 08/17/24 0250 08/18/24 0312  NA 136 139 137 137  K 3.4* 3.7 3.0* 4.0  CL 99 103 103 104  CO2 23 25 24 28   GLUCOSE 128* 79 109* 101*  BUN 8 8 7  <5*  CREATININE 0.96 0.77 0.78 0.72  CALCIUM 9.0 8.9 8.0* 8.2*   Liver Function Tests: Recent Labs  Lab 08/15/24 1145 08/16/24 0838  AST 34 30  ALT 41 37  ALKPHOS 45 46  BILITOT 0.3 0.3  PROT 6.8 7.2  ALBUMIN 3.8 4.0   CBG: No results for input(s): GLUCAP in the last 168 hours.  Discharge time spent: greater than 30 minutes.  Signed: Owen DELENA Lore, MD [facility] 08/19/2024 "

## 2024-08-19 NOTE — Plan of Care (Signed)

## 2024-08-20 LAB — CULTURE, BLOOD (ROUTINE X 2): Culture: NO GROWTH

## 2024-08-22 ENCOUNTER — Encounter: Payer: Self-pay | Admitting: Nurse Practitioner

## 2024-08-23 ENCOUNTER — Other Ambulatory Visit: Payer: Self-pay | Admitting: General Surgery

## 2024-08-23 ENCOUNTER — Ambulatory Visit: Admitting: Nurse Practitioner

## 2024-08-23 ENCOUNTER — Encounter: Payer: Self-pay | Admitting: Nurse Practitioner

## 2024-08-23 VITALS — BP 102/70 | HR 85 | Temp 97.5°F | Ht 67.0 in | Wt 155.6 lb

## 2024-08-23 DIAGNOSIS — N10 Acute tubulo-interstitial nephritis: Secondary | ICD-10-CM | POA: Diagnosis not present

## 2024-08-23 DIAGNOSIS — K649 Unspecified hemorrhoids: Secondary | ICD-10-CM | POA: Insufficient documentation

## 2024-08-23 DIAGNOSIS — K8010 Calculus of gallbladder with chronic cholecystitis without obstruction: Secondary | ICD-10-CM | POA: Diagnosis not present

## 2024-08-23 DIAGNOSIS — E611 Iron deficiency: Secondary | ICD-10-CM | POA: Insufficient documentation

## 2024-08-23 DIAGNOSIS — E538 Deficiency of other specified B group vitamins: Secondary | ICD-10-CM | POA: Diagnosis not present

## 2024-08-23 MED ORDER — ONDANSETRON HCL 4 MG PO TABS: 4.0000 mg | ORAL_TABLET | Freq: Three times a day (TID) | ORAL | 0 refills | Status: AC | PRN

## 2024-08-23 MED ORDER — HYDROCODONE-ACETAMINOPHEN 5-325 MG PO TABS: 1.0000 | ORAL_TABLET | ORAL | 0 refills | Status: DC | PRN

## 2024-08-23 NOTE — Assessment & Plan Note (Signed)
 Low folic acid  levels were noted during recent hospitalization. She is currently taking folic acid  supplements. Continue folic acid  supplementation.

## 2024-08-23 NOTE — Assessment & Plan Note (Signed)
 Low B12 levels were noted during recent hospitalization. She is currently taking B12 supplements. Continue B12 supplementation.

## 2024-08-23 NOTE — Telephone Encounter (Signed)
 Patient seen in office on 08/23/24 by PCP.

## 2024-08-23 NOTE — Assessment & Plan Note (Signed)
 She has a recent hemorrhoid with bleeding and pain. Preparation H is somewhat effective. Continue using Preparation H. Consider Miralax  or Colace for constipation.

## 2024-08-23 NOTE — Patient Instructions (Addendum)
 It was great to see you!  I have refilled your pain medication  Start zofran  every 8 hours as needed for nausea or vomiting   Keep drinking fluids  Limit fried/fatty foods   You can take miralax  or colace as needed for constipation   I will keep you out until 08/30/24  Let's follow-up in 2 months, sooner if you have concerns.  If a referral was placed today, you will be contacted for an appointment. Please note that routine referrals can sometimes take up to 3-4 weeks to process. Please call our office if you haven't heard anything after this time frame.  Take care,  Tinnie Harada, NP

## 2024-08-23 NOTE — Assessment & Plan Note (Signed)
 Low iron levels were noted during recent hospitalization. She is currently taking iron supplements. Continue iron supplementation every other day with food.

## 2024-08-23 NOTE — Assessment & Plan Note (Signed)
 She was recently hospitalized for a severe UTI that progressed to a kidney infection. She is currently asymptomatic with stable blood pressure. Continue current antibiotics as prescribed.

## 2024-08-23 NOTE — Progress Notes (Signed)
 "  Established Patient Office Visit  Subjective   Patient ID: Elaya Droege, female    DOB: 21-Oct-1990  Age: 34 y.o. MRN: [id number]  Chief Complaint  Patient presents with   Hospitalization Follow-up    [facility] to ED on 08/16/24 Dx with Pyelonephritis and back pain, request something for pain, has nausea and vomiting    HPI  Discussed the use of AI scribe software for clinical note transcription with the patient, who gave verbal consent to proceed.  History of Present Illness   Charlye Spare Metta is a 34 year old female who presents with severe back pain and gallbladder symptoms.  She was recently hospitalized where a gallstone was diagnosed and the next day developed severe back pain requiring an ER visit and admission for a urinary tract infection that progressed to a kidney infection. She remains on antibiotics and continues to have gallbladder symptoms, with surgical follow-up planned with [facility].  Her back pain is severe, constant in the upper back between the shoulder blades, with intermittent sharp episodes. Lying down for long periods worsens it and sitting up gives partial relief. She uses hydrocodone  from discharge, taking two tablets every four hours at night and less often during the day. She has nausea, worse after spicy foods, with occasional vomiting.  She has a symptomatic hemorrhoid with mild pain and itching that initially bled in the hospital but is now less swollen. Preparation H provides some relief while the steroid cream in the hospital made it burn and worse. She also had painful vaginal cuts and a blistering rash after a vaginal cream, now treated with topical hydrocortisone .  She has low iron, B12, and folic acid  and is taking supplements. She drinks electrolyte beverages to maintain hydration and has stopped soda. She is currently on short-term disability from work due to the recent hospitalization and upcoming surgery.        ROS See pertinent positives and negatives per HPI.    Objective:     BP 102/70 (BP Location: Left Arm, Patient Position: Sitting, Cuff Size: Normal)   Pulse 85   Temp (!) 97.5 F (36.4 C)   Ht 5' 7 (1.702 m)   Wt 155 lb 9.6 oz (70.6 kg)   SpO2 99%   BMI 24.37 kg/m  BP Readings from Last 3 Encounters:  08/23/24 102/70  08/19/24 105/67  08/15/24 115/74   Wt Readings from Last 3 Encounters:  08/23/24 155 lb 9.6 oz (70.6 kg)  08/16/24 160 lb (72.6 kg)  06/06/24 154 lb 12.8 oz (70.2 kg)      Physical Exam Vitals and nursing note reviewed.  Constitutional:      General: She is not in acute distress.    Appearance: Normal appearance.  HENT:     Head: Normocephalic.  Eyes:     Conjunctiva/sclera: Conjunctivae normal.  Cardiovascular:     Rate and Rhythm: Normal rate and regular rhythm.     Pulses: Normal pulses.     Heart sounds: Normal heart sounds.  Pulmonary:     Effort: Pulmonary effort is normal.     Breath sounds: Normal breath sounds.  Abdominal:     General: There is no distension.     Palpations: Abdomen is soft.     Tenderness: There is abdominal tenderness (RUQ, epigastric area). There is no right CVA tenderness or left CVA tenderness.     Hernia: No hernia is present.  Musculoskeletal:        General:  Tenderness (thoracic spine between scapula) present.     Cervical back: Normal range of motion.  Skin:    General: Skin is warm.  Neurological:     General: No focal deficit present.     Mental Status: She is alert and oriented to person, place, and time.  Psychiatric:        Mood and Affect: Mood normal.        Behavior: Behavior normal.        Thought Content: Thought content normal.        Judgment: Judgment normal.       Assessment & Plan:   Problem List Items Addressed This Visit       Cardiovascular and Mediastinum   Hemorrhoids   She has a recent hemorrhoid with bleeding and pain. Preparation H is somewhat effective. Continue  using Preparation H. Consider Miralax  or Colace for constipation.        Digestive   Calculus of gallbladder with chronic cholecystitis without obstruction - Primary   Chronic cholecystitis with gallstones is causing severe pain and nausea. Surgery is planned for gallbladder removal on 08/29/24 unless an opening becomes available sooner. Refill hydrocodone  for pain management 1-2 tablets every 4 hours as needed and prescribe Zofran  4mg  every 8 hours as needed for nausea. Advise limiting fried and fatty foods. Follow up with surgery for gallbladder removal. PDMP reviewed. Will complete FMLA forms for missed work from 08/15/24 through 08/30/24. Discussed that general surgery will complete FMLA forms for any time needed for recovery from surgery.         Genitourinary   Acute pyelonephritis   She was recently hospitalized for a severe UTI that progressed to a kidney infection. She is currently asymptomatic with stable blood pressure. Continue current antibiotics as prescribed.        Other   Iron deficiency   Low iron levels were noted during recent hospitalization. She is currently taking iron supplements. Continue iron supplementation every other day with food.      B12 deficiency   Low B12 levels were noted during recent hospitalization. She is currently taking B12 supplements. Continue B12 supplementation.      Folate deficiency   Low folic acid  levels were noted during recent hospitalization. She is currently taking folic acid  supplements. Continue folic acid  supplementation.       Return in about 2 months (around 10/21/2024) for follow-up.    Tinnie DELENA Harada, NP  "

## 2024-08-23 NOTE — Assessment & Plan Note (Signed)
 Chronic cholecystitis with gallstones is causing severe pain and nausea. Surgery is planned for gallbladder removal on 08/29/24 unless an opening becomes available sooner. Refill hydrocodone  for pain management 1-2 tablets every 4 hours as needed and prescribe Zofran  4mg  every 8 hours as needed for nausea. Advise limiting fried and fatty foods. Follow up with surgery for gallbladder removal. PDMP reviewed. Will complete FMLA forms for missed work from 08/15/24 through 08/30/24. Discussed that general surgery will complete FMLA forms for any time needed for recovery from surgery.

## 2024-08-24 ENCOUNTER — Other Ambulatory Visit: Payer: Self-pay

## 2024-08-24 ENCOUNTER — Encounter (HOSPITAL_COMMUNITY): Payer: Self-pay | Admitting: General Surgery

## 2024-08-24 DIAGNOSIS — Z01818 Encounter for other preprocedural examination: Secondary | ICD-10-CM

## 2024-08-24 NOTE — Progress Notes (Signed)
 SDW call  Patient was given pre-op instructions over the phone. Patient verbalized understanding of instructions provided.  Patient was admitted to [facility] 1/5-1/9 for pyelonephritis, currently on 3 antibiotics.  States she is feeling much better with no UTI symptoms, but the RUQ pain continues. Denies any SOB, fever or cough   PCP - Tinnie Harada, NP Cardiologist -  Pulmonary:    PPM/ICD - denies Device Orders - na Rep Notified - na   Chest x-ray - 08/15/2024 EKG -  08/18/2024 Stress Test - 07/22/2020 ECHO -  Cardiac Cath -   Sleep Study/sleep apnea/CPAP: denies  Non-diabetic  Blood Thinner Instructions: denies Aspirin Instructions:denies   ERAS Protcol - Clears until 0430   Anesthesia review: Yes, recent admission  Your procedure is scheduled on Thursday August 25, 2024  Report to [facility] Main Entrance A at  0530  A.M., then check in with the Admitting office.  Call this number if you have problems the morning of surgery:  [phone]   If you have any questions prior to your surgery date call [phone]: Open Monday-Friday 8am-4pm If you experience any cold or flu symptoms such as cough, fever, chills, shortness of breath, etc. between now and your scheduled surgery, please notify us  at the above number     Remember:  Do not eat after midnight the night before your surgery  You may drink clear liquids until  0430   the morning of your surgery.   Clear liquids allowed are: Water, Non-Citrus Juices (without pulp), Carbonated Beverages, Clear Tea, Black Coffee ONLY (NO MILK, CREAM OR POWDERED CREAMER of any kind), and Gatorade   Take these medicines the morning of surgery with A SIP OF WATER:  Cefpodoxime , BCP, doxycycline , flagyl   As needed: Tylenol , albuterol , hydrocodone , hydroxyzine , zofran   As of today, STOP taking any Aspirin (unless otherwise instructed by your surgeon) Aleve, Naproxen, Ibuprofen , Motrin , Advil , Goody's, BC's, all herbal medications, fish  oil, and all vitamins.

## 2024-08-25 ENCOUNTER — Ambulatory Visit (HOSPITAL_COMMUNITY)

## 2024-08-25 ENCOUNTER — Other Ambulatory Visit (HOSPITAL_COMMUNITY): Payer: Self-pay

## 2024-08-25 ENCOUNTER — Ambulatory Visit (HOSPITAL_COMMUNITY): Admitting: Anesthesiology

## 2024-08-25 ENCOUNTER — Encounter: Admission: RE | Disposition: A | Payer: Self-pay | Attending: General Surgery

## 2024-08-25 ENCOUNTER — Telehealth (HOSPITAL_COMMUNITY): Payer: Self-pay

## 2024-08-25 ENCOUNTER — Encounter (HOSPITAL_COMMUNITY): Payer: Self-pay | Admitting: General Surgery

## 2024-08-25 ENCOUNTER — Ambulatory Visit (HOSPITAL_COMMUNITY)
Admission: RE | Admit: 2024-08-25 | Discharge: 2024-08-25 | Disposition: A | Discharge status: Home / Self Care | Attending: General Surgery | Admitting: General Surgery

## 2024-08-25 DIAGNOSIS — K8010 Calculus of gallbladder with chronic cholecystitis without obstruction: Secondary | ICD-10-CM | POA: Insufficient documentation

## 2024-08-25 DIAGNOSIS — K8020 Calculus of gallbladder without cholecystitis without obstruction: Secondary | ICD-10-CM

## 2024-08-25 DIAGNOSIS — Z01818 Encounter for other preprocedural examination: Secondary | ICD-10-CM

## 2024-08-25 HISTORY — PX: CHOLECYSTECTOMY: SHX55

## 2024-08-25 LAB — POCT PREGNANCY, URINE: Preg Test, Ur: NEGATIVE

## 2024-08-25 MED ORDER — PROPOFOL 10 MG/ML IV BOLUS
INTRAVENOUS | Status: AC
  Filled 2024-08-25: qty 20

## 2024-08-25 MED ORDER — MIDAZOLAM HCL 2 MG/2ML IJ SOLN
INTRAMUSCULAR | Status: AC
  Filled 2024-08-25: qty 2

## 2024-08-25 MED ORDER — ONDANSETRON HCL 4 MG/2ML IJ SOLN: 4.0000 mg | Freq: Once | INTRAMUSCULAR | Status: DC | PRN

## 2024-08-25 MED ORDER — HYDROMORPHONE HCL 1 MG/ML IJ SOLN
INTRAMUSCULAR | Status: AC
  Filled 2024-08-25: qty 1

## 2024-08-25 MED ORDER — 0.9 % SODIUM CHLORIDE (POUR BTL) OPTIME
TOPICAL | Status: DC | PRN
  Administered 2024-08-25: 1000 mL

## 2024-08-25 MED ORDER — BUPIVACAINE-EPINEPHRINE (PF) 0.25% -1:200000 IJ SOLN
INTRAMUSCULAR | Status: DC | PRN
  Administered 2024-08-25: 30 mL

## 2024-08-25 MED ORDER — SODIUM CHLORIDE 0.9% FLUSH: 3.0000 mL | Freq: Two times a day (BID) | INTRAVENOUS | Status: DC

## 2024-08-25 MED ORDER — MEPERIDINE HCL 25 MG/ML IJ SOLN: 6.2500 mg | INTRAMUSCULAR | Status: DC | PRN

## 2024-08-25 MED ORDER — HYDROMORPHONE HCL 1 MG/ML IJ SOLN
0.2500 mg | INTRAMUSCULAR | Status: DC | PRN
  Administered 2024-08-25 (×4): 0.5 mg via INTRAVENOUS

## 2024-08-25 MED ORDER — AMISULPRIDE (ANTIEMETIC) 5 MG/2ML IV SOLN: 10.0000 mg | Freq: Once | INTRAVENOUS | Status: DC | PRN

## 2024-08-25 MED ORDER — SODIUM CHLORIDE 0.9 % IR SOLN
Status: DC | PRN
  Administered 2024-08-25: 1000 mL

## 2024-08-25 MED ORDER — MIDAZOLAM HCL (PF) 2 MG/2ML IJ SOLN
INTRAMUSCULAR | Status: DC | PRN
  Administered 2024-08-25: 2 mg via INTRAVENOUS

## 2024-08-25 MED ORDER — CHLORHEXIDINE GLUCONATE 0.12 % MT SOLN
OROMUCOSAL | Status: AC
  Administered 2024-08-25: 15 mL
  Filled 2024-08-25: qty 15

## 2024-08-25 MED ORDER — SCOPOLAMINE 1 MG/3DAYS TD PT72
1.0000 | MEDICATED_PATCH | TRANSDERMAL | Status: DC
  Administered 2024-08-25: 1 mg via TRANSDERMAL
  Filled 2024-08-25: qty 1

## 2024-08-25 MED ORDER — BUPIVACAINE-EPINEPHRINE (PF) 0.25% -1:200000 IJ SOLN
INTRAMUSCULAR | Status: AC
  Filled 2024-08-25: qty 30

## 2024-08-25 MED ORDER — SODIUM CHLORIDE 0.9 % IV SOLN: 250.0000 mL | INTRAVENOUS | Status: DC | PRN

## 2024-08-25 MED ORDER — KETOROLAC TROMETHAMINE 30 MG/ML IJ SOLN
30.0000 mg | Freq: Once | INTRAMUSCULAR | Status: AC | PRN
  Administered 2024-08-25: 30 mg via INTRAVENOUS

## 2024-08-25 MED ORDER — CEFAZOLIN SODIUM-DEXTROSE 2-4 GM/100ML-% IV SOLN
2.0000 g | INTRAVENOUS | Status: AC
  Administered 2024-08-25: 2 g via INTRAVENOUS
  Filled 2024-08-25: qty 100

## 2024-08-25 MED ORDER — DEXMEDETOMIDINE HCL IN NACL 80 MCG/20ML IV SOLN
INTRAVENOUS | Status: DC | PRN
  Administered 2024-08-25 (×2): 8 ug via INTRAVENOUS

## 2024-08-25 MED ORDER — FENTANYL CITRATE (PF) 100 MCG/2ML IJ SOLN
INTRAMUSCULAR | Status: AC
  Filled 2024-08-25: qty 2

## 2024-08-25 MED ORDER — SODIUM CHLORIDE 0.9% FLUSH: 3.0000 mL | INTRAVENOUS | Status: DC | PRN

## 2024-08-25 MED ORDER — HYDROMORPHONE HCL 1 MG/ML IJ SOLN
INTRAMUSCULAR | Status: AC
  Filled 2024-08-25: qty 0.5

## 2024-08-25 MED ORDER — IBUPROFEN 200 MG PO TABS
600.0000 mg | ORAL_TABLET | Freq: Four times a day (QID) | ORAL | 0 refills | Status: AC
  Filled 2024-08-25: qty 75, 7d supply, fill #0

## 2024-08-25 MED ORDER — ROCURONIUM BROMIDE 10 MG/ML (PF) SYRINGE
PREFILLED_SYRINGE | INTRAVENOUS | Status: DC | PRN
  Administered 2024-08-25: 60 mg via INTRAVENOUS

## 2024-08-25 MED ORDER — DEXAMETHASONE SOD PHOSPHATE PF 10 MG/ML IJ SOLN
INTRAMUSCULAR | Status: DC | PRN
  Administered 2024-08-25: 10 mg via INTRAVENOUS

## 2024-08-25 MED ORDER — LIDOCAINE 2% (20 MG/ML) 5 ML SYRINGE
INTRAMUSCULAR | Status: DC | PRN
  Administered 2024-08-25: 60 mg via INTRAVENOUS

## 2024-08-25 MED ORDER — CHLORHEXIDINE GLUCONATE CLOTH 2 % EX PADS: 6.0000 | MEDICATED_PAD | Freq: Once | CUTANEOUS | Status: DC

## 2024-08-25 MED ORDER — ACETAMINOPHEN 325 MG PO TABS: 650.0000 mg | ORAL_TABLET | ORAL | Status: DC | PRN

## 2024-08-25 MED ORDER — ACETAMINOPHEN 325 MG PO TABS
650.0000 mg | ORAL_TABLET | Freq: Four times a day (QID) | ORAL | 0 refills | Status: AC
  Filled 2024-08-25: qty 50, 7d supply, fill #0

## 2024-08-25 MED ORDER — HYDROMORPHONE HCL 1 MG/ML IJ SOLN
INTRAMUSCULAR | Status: DC | PRN
  Administered 2024-08-25 (×2): .5 mg via INTRAVENOUS

## 2024-08-25 MED ORDER — ACETAMINOPHEN 500 MG PO TABS
1000.0000 mg | ORAL_TABLET | ORAL | Status: AC
  Administered 2024-08-25: 1000 mg via ORAL
  Filled 2024-08-25: qty 2

## 2024-08-25 MED ORDER — OXYCODONE HCL 5 MG PO TABS: 5.0000 mg | ORAL_TABLET | Freq: Once | ORAL | Status: DC | PRN

## 2024-08-25 MED ORDER — OXYCODONE HCL 5 MG PO TABS: 5.0000 mg | ORAL_TABLET | ORAL | Status: DC | PRN

## 2024-08-25 MED ORDER — PHENYLEPHRINE 80 MCG/ML (10ML) SYRINGE FOR IV PUSH (FOR BLOOD PRESSURE SUPPORT)
PREFILLED_SYRINGE | INTRAVENOUS | Status: DC | PRN
  Administered 2024-08-25 (×2): 160 ug via INTRAVENOUS

## 2024-08-25 MED ORDER — MORPHINE SULFATE (PF) 2 MG/ML IV SOLN: 1.0000 mg | INTRAVENOUS | Status: DC | PRN

## 2024-08-25 MED ORDER — KETOROLAC TROMETHAMINE 30 MG/ML IJ SOLN
INTRAMUSCULAR | Status: AC
  Filled 2024-08-25: qty 1

## 2024-08-25 MED ORDER — OXYCODONE HCL 5 MG PO TABS
5.0000 mg | ORAL_TABLET | Freq: Three times a day (TID) | ORAL | 0 refills | Status: AC | PRN
  Filled 2024-08-25: qty 12, 7d supply, fill #0

## 2024-08-25 MED ORDER — ACETAMINOPHEN 650 MG RE SUPP: 650.0000 mg | RECTAL | Status: DC | PRN

## 2024-08-25 MED ORDER — ONDANSETRON HCL 4 MG/2ML IJ SOLN
INTRAMUSCULAR | Status: DC | PRN
  Administered 2024-08-25: 4 mg via INTRAVENOUS

## 2024-08-25 MED ORDER — LACTATED RINGERS IV SOLN: INTRAVENOUS | Status: DC | PRN

## 2024-08-25 MED ORDER — OXYCODONE HCL 5 MG/5ML PO SOLN: 5.0000 mg | Freq: Once | ORAL | Status: DC | PRN

## 2024-08-25 MED ORDER — INDOCYANINE GREEN 25 MG IJ SOLR
INTRAMUSCULAR | Status: DC | PRN
  Administered 2024-08-25: 7.5 mg via INTRAVENOUS

## 2024-08-25 MED ORDER — SUGAMMADEX SODIUM 200 MG/2ML IV SOLN
INTRAVENOUS | Status: DC | PRN
  Administered 2024-08-25: 200 mg via INTRAVENOUS

## 2024-08-25 MED ORDER — FENTANYL CITRATE (PF) 250 MCG/5ML IJ SOLN
INTRAMUSCULAR | Status: DC | PRN
  Administered 2024-08-25 (×2): 50 ug via INTRAVENOUS
  Administered 2024-08-25: 100 ug via INTRAVENOUS

## 2024-08-25 MED ORDER — PROPOFOL 10 MG/ML IV BOLUS
INTRAVENOUS | Status: DC | PRN
  Administered 2024-08-25: 200 mg via INTRAVENOUS

## 2024-08-25 NOTE — Anesthesia Procedure Notes (Signed)
 Procedure Name: Intubation Date/Time: 08/25/2024 7:31 AM  Performed by: Scherrie Mast, CRNAPre-anesthesia Checklist: Patient identified, Emergency Drugs available, Suction available and Patient being monitored Patient Re-evaluated:Patient Re-evaluated prior to induction Oxygen Delivery Method: Circle System Utilized Preoxygenation: Pre-oxygenation with 100% oxygen Induction Type: IV induction Ventilation: Mask ventilation without difficulty Laryngoscope Size: Mac and 4 Grade View: Grade I Tube type: Oral Tube size: 7.0 mm Number of attempts: 1 Airway Equipment and Method: Stylet and Oral airway Placement Confirmation: ETT inserted through vocal cords under direct vision, positive ETCO2 and breath sounds checked- equal and bilateral Secured at: 21 cm Tube secured with: Tape Dental Injury: Teeth and Oropharynx as per pre-operative assessment

## 2024-08-25 NOTE — Telephone Encounter (Signed)
 Pharmacy Patient Advocate Encounter  Received notification from [facility] that Prior Authorization for oxyCODONE  HCl 5MG  tablets has been APPROVED from 08/25/24 to 09/24/24   PA #/Case ID/Reference #: [id number]

## 2024-08-25 NOTE — Anesthesia Preprocedure Evaluation (Addendum)
"                                    Anesthesia Evaluation  Patient identified by MRN, date of birth, ID band Patient awake    Reviewed: Allergy & Precautions, H&P , NPO status , Patient's Chart, lab work & pertinent test results  Airway Mallampati: III  TM Distance: >3 FB Neck ROM: Full    Dental no notable dental hx. (+) Teeth Intact, Dental Advisory Given   Pulmonary asthma (albuterol  less than once a month)    Pulmonary exam normal breath sounds clear to auscultation       Cardiovascular negative cardio ROS Normal cardiovascular exam Rhythm:Regular Rate:Normal     Neuro/Psych  Headaches PSYCHIATRIC DISORDERS Anxiety        GI/Hepatic negative GI ROS, Neg liver ROS,,,  Endo/Other  negative endocrine ROS    Renal/GU negative Renal ROS  negative genitourinary   Musculoskeletal negative musculoskeletal ROS (+)    Abdominal   Peds negative pediatric ROS (+)  Hematology  (+) Blood dyscrasia, anemia Hb 11.4, plt 144   Anesthesia Other Findings   Reproductive/Obstetrics Urine preg today neg                              Anesthesia Physical Anesthesia Plan  ASA: 2  Anesthesia Plan: General   Post-op Pain Management: Tylenol  PO (pre-op)*, Toradol  IV (intra-op)*, Ketamine IV*, Dilaudid  IV and Precedex    Induction: Intravenous  PONV Risk Score and Plan: 4 or greater and Ondansetron , Dexamethasone , Scopolamine  patch - Pre-op, Midazolam  and Treatment may vary due to age or medical condition  Airway Management Planned: Oral ETT  Additional Equipment: None  Intra-op Plan:   Post-operative Plan: Extubation in OR  Informed Consent: I have reviewed the patients History and Physical, chart, labs and discussed the procedure including the risks, benefits and alternatives for the proposed anesthesia with the patient or authorized representative who has indicated his/her understanding and acceptance.     Dental advisory  given  Plan Discussed with: CRNA  Anesthesia Plan Comments:          Anesthesia Quick Evaluation  "

## 2024-08-25 NOTE — Op Note (Signed)
 Patient: Erin Pratt MRN: [id number] DOB: 07/21/1991 Sex: female Operation/Procedure Date: 08/25/2024 Surgeons and Role:    * Benjiman Sedgwick, Cordella LABOR, MD - Primary Pre-operative Diagnoses: SYMPTOMATIC CHOLELITHIASIS Postoperative Diagnoses: SYMPTOMATIC CHOLELITHIASIS  Procedure performed: Procedure:    LAPAROSCOPIC CHOLECYSTECTOMY With ICG Cholangiography CPT(R) Code:  52437 - PR LAPAROSCOPY SURG CHOLECYSTECTOMY  Anesthesia: General endotracheal anesthesia  Indications: Erin Pratt is a 34 year old female who was evaluated in the office and determined to have gallbladder disease. I offered her a cholecystectomy. Preoperatively, I discussed in detail the risks, benefits, alternatives, and potential complications. The patient understands and requests to proceed.  Operative Findings: Critical view obtained prior to placing clips.  Operative Narrative: The patient was positively identified and was taken to the operating room and placed supine on the operating table. A time-out was performed confirming correct patient and procedure. We also confirmed initiation of deep venous thrombosis prophylaxis and wound prophylaxis. After successful induction of general endotracheal anesthesia, the arms were carefully padded. An orogastric tube and footboard were placed. The abdomen was prepped and draped in the usual sterile surgical fashion.  We began our peritoneal access with a veress needle inserted at Palmer's point.  After aspiration showed return of air bubbles and there was a positive saline drop test, the insufflation was connected and the abdomen brought to a pressure of .  We then used an opti-view technique to place a 5mm port just to the right of midline, superior to the umbilicus.  A laparoscope was introduced into the abdomen, and there were no signs of injury from entry.  A 12mm port was placed in the subxiphoid position.  Two additional 5mm ports were placed in the RUQ.  A  360-degree visualization with a 30-degree 5-mm laparoscope revealed grossly normal intra-abdominal contents.   The patient was placed in the head up position and tilted slightly to the left. The dome of the gallbladder was then grasped, elevated, and retracted anteriorly and cephalad.  The infundibulum was retracted laterally and inferiorly exposing Calot's triangle. The investing visceral peritoneal attachments overlying the infundibulum of the gallbladder were incised using the hook electrocautery and dissected free from the gallbladder itself. We soon developed two structures into the gallbladder consistent with the cystic duct and cystic artery. The loose areolar tissue around these structures was dissected free. The gallbladder was separated from the gallbladder fossa for approximately a third the distance up from the cystic plate, establishing the critical view of safety. We then transitioned to infrared viewing mode to allow for visualization of the ICG tracer. There was tracer throughout the liver. There was tracer within the candidate cystic duct as well as in a separate structure medially to the duct, consistent with where the common bile duct would be expected.  There was no tracer within the candidate cystic artery.  There was tracer within the duodenum, indicating no presence of a biliary obstruction. We returned to normal viewing mode. The cystic duct and cystic artery were triply clipped. These were divided using laparoscopic scissors leaving a single clip on the removal side. The gallbladder was elevated off the gallbladder fossa using the hook Bovie. The gallbladder was then exteriorized through the subxiphoid port site using an EndoCatch bag. We reestablished pneumoperitoneum and confirmed no leakage of blood or bile. We confirmed integrity of our clips. The subhepatic space was irrigated with warm sterile saline and suctioned free. The 12mm port site was closed using an 0-vicryl on a suture  passer.We placed 0.25% Marcaine   with epinephrine  at each incision site for local anesthesia. The skin was closed using 4-0 Monocryl subcuticular suture. Dermabond was applied. The patient tolerated the procedure well, was extubated, and taken to the recovery room.  Estimated Blood Loss: Minimal Specimens: Gallbladder Implants: None Drains: None Complications: None Condition of the patient: Good, extubated Disposition: PACU  Cordella DELENA Idler Date: 08/25/2024 Time: 8:43 AM

## 2024-08-25 NOTE — Anesthesia Postprocedure Evaluation (Addendum)
"   Anesthesia Post Note  Patient: Erin Pratt  Procedure(s) Performed: LAPAROSCOPIC CHOLECYSTECTOMY With ICG Cholangiography     Patient location during evaluation: PACU Anesthesia Type: General Level of consciousness: awake and alert Pain management: pain level controlled Vital Signs Assessment: post-procedure vital signs reviewed and stable Respiratory status: spontaneous breathing, nonlabored ventilation and respiratory function stable Cardiovascular status: blood pressure returned to baseline and stable Postop Assessment: no apparent nausea or vomiting Anesthetic complications: no   There were no known notable events for this encounter.                Erin Pratt      "

## 2024-08-25 NOTE — Discharge Instructions (Signed)
 Home Care After Gallbladder Removal  Activity  Limit activity for the first 24 hours, then you may return to normal daily activities. Returning to normal daily activities as soon as you can following surgery will enhance recovery time.  No heavy lifting pushing or pulling, anything heavier than 10 pounds (gallon of milk weighs approx. 8.8 pounds) for 2 weeks from surgery date.  Do not mow the lawn, use a vacuum cleaner, or do any other strenuous activities without first consulting your surgical team.  Climb stairs slowly and watch your step.  Walk as often as you feel able to increase strength and endurance.  No driving or operating heavy machinery within 24 hours of taking narcotic pain medication.  Diet  Drink plenty of fluids and eat a light meal on the night of surgery. Some patients may find their appetite is poor for a week or two after surgery. This is a normal result of the stress of surgery-your appetite will return in time.   There are no specific diet restrictions after surgery and you resume regular diet as tolerated. However, you may want to refrain from fatty, heavy, and/or greasy foods and follow a low fat diet for 2-4 weeks to avoid temporary diarrhea and nausea. Slowly add fats back into diet.   If you have diarrhea, try avoiding spicy foods, dairy products, fatty foods, and alcohol. You can also watch to see if specific foods cause it, and stop eating them. If the diarrhea continues for more than 2 weeks, talk to your doctor.  Dressings and Wound Care  Keep your wound or incision site clean and dry.  You have Dermabond/Durabond (skin glue) on your incision: This will usually remain in place for 10-14 days, then naturally fall off your skin. You may take a shower 24 hrs after surgery, carefully wash, not scrub the incision site with a mild non-scented soap. Pat dry with a soft towel.  Do not pick or peel skin glue off.  You can shower and let the water fall on the dressings above.  Do not soak or submerge your incision(s) in a bath tub, hot tub, or swimming pool, until your doctor says it is ok to do so or the incision(s) have completely healed, usually about 2-4 weeks.  Do not use creams, powder, salves or balms on your incision(s).  What to Expect After Surgery  Moderate discomfort controlled with medications  You may feel pain in one or both shoulders. This pain comes from the gas still left in your belly after the surgery, if you had laparoscopic surgery (several small incisions). The pain should ease over several days to a week. Ambulation will help with this pain.   Minimal drainage from incision  Belly swelling  Feeling fatigue and weak  Loose stools after eating. This may occur for 4-8 weeks; however longer in some cases.   Constipation after surgery is common. Drink plenty fluids and eat a high fiber diet.   Pain Control: Prescribed Non-Narcotic Pain Medication  You will be given three prescriptions.  Two of them will be for prescription strength ibuprofen  (i.e. Advil ) and prescription strength acetaminophen  (i.e. Tylenol ).  The vast majority of patients will just need these two medications.  One prescription will be for a 'rescue' prescription of an oral narcotic (oxycodone ).  You may fill this if needed.  You will alternate taking the ibuprofen  (600mg ) every 6 hours and also the acetaminophen  (650mg ) every 6 hours so that you are taking one of those medications  every 3 hours.  For example: o 0800 - take ibuprofen  600mg  o 1100 - take acetaminophen  650mg  o 1400 - take ibuprofen  600mg  o 1700 - take acetaminophen  650mg  o Etc  Continue taking this alternating pattern of ibuprofen  and acetaminophen  for 3 days  If you cannot take one or the other of these medications, just take the one you can every 6 hours.  If you are comfortable at night, you don't have to wake up and take a medication.  If you are still uncomfortable after taking either ibuprofen  or  acetaminophen , try gentle stretching exercise and ice packs (a bag of frozen vegetables works great).  If you are still uncomfortable, you may fill the narcotic prescription of Oxycodone  and take as directed.  Once you have completed these prescriptions, your pain level should be low enough to stop taking medications altogether or just use an over the counter medication (ibuprofen  or acetaminophen ) as needed.   Pain Control: Over the Counter Medications to take as needed  Colace/Docusate: May be prescribed by your surgeon to prevent constipation caused by the combination of narcotics, effects of anesthesia, and decreased ambulation.  Hold for loose stools or diarrhea. Take 100 mg 1-2 times a day starting tonight.   Fiber: High fiber foods, extra liquids (water 9-13 cups/day) can also assist with constipation. Examples of high fiber foods are fruit, bran. Prune juice and water are also good liquids to drink.  Milk of Magnesia/Miralax :  If constipated despite take the over the counter stool softeners, you may take Milk of Magnesia or Miralax  as directed on bottle to assist with constipation.     Pepcid /Famotidine : May be prescribed while taking naproxen (Aleve) or other NSAIDs such as ibuprofen  (Motrin /Advil ) to prevent stomach upset or Acid-reflux symptoms. Take 1 tablet 1-2 times a day.   **Constipation: The first bowel movement may occur anywhere between 1-5 days after surgery.  As long as you are not nauseated or not having significant abdominal pain this variation is acceptable. Narcotic pain medications can cause constipation increasing discomfort; early discontinuation will assist with bowel management.If constipated despite taking stool softeners,  you may take Milk of Magnesia or Miralax  as directed on the bottle.     **Home medications: You may restart your home medications as directed by your respective Primary Care Physician or Surgeon.   When to notify your Doctor or Healthcare Team  Sign  of Wound Infection   Fever over 100 degrees.  Wound becomes extremely swollen, shows red streaks, warm to the touch, and/or drainage from the incision site or foul-smelling drainage.  Wound edges separate or opens up  Bleeding or bruising   If you have bleeding, apply pressure to the site and hold the pressure firmly for 5 minutes. If the bleeding continues, apply pressure again and call 911. If the bleeding stopped, call your doctor to report it.   Call your doctor or nurse if you have increased bleeding from your site and increased bruising or a lump forms or gets larger under your skin at the site.  Unrelieved Pain    Call your doctor or nurse if your pain gets worse or is not eased 1 hour after taking your pain medicine, or if it is severe and uncontrolled.  Fever, Flu-like symptoms   Fever over 100 degrees and/or chills  Gastrointestinal Symptoms    If you have yellowing of your eyes or skin (jaundice)  If you have dark or rust-colored urine  If your stool is gray colored  If you have nausea and vomiting that continues more than 24 hours, will not let you keep medicine down and will not let you keep fluids down  Black tarry bowel movements. This can be normal after surgery on the stomach, but should resolve in a day or two.    Call 911 if you suddenly have signs of blood loss such as:  Vomiting blood  Fast heart rate  Feeling faint, sweaty, or blacking out  Passing bright red blood from your rectum  Blood Clot Symptoms   Tender, swollen or reddened areas in your calf muscle or thighs.  Numbness or tingling in your lower leg or calf, or at the top of your leg or groin  Skin on your leg looks pale or blue or feels cold to touch  Chest pain or have trouble breathing, lightheadedness, fast heart rate  Sudden Onset of Symptoms    Call 911 if you suddenly have:  Leg weakness and spasm  Loss of bladder or bowel function  Seizure  Confusion, severe headache, dizziness or feeling  unsteady, problems talking, difficulty swallowing, and/or numbness or muscle weakness as these could be signs of a stroke.   Follow up Appointment Your follow up appointment should be scheduled 2-3 weeks after your surgery date.  If you have not previously scheduled for a follow-up visit you can be scheduled by contacting [phone]

## 2024-08-25 NOTE — H&P (Signed)
 "    Erin Pratt [facility] 05/13/1991  [id number].    HPI:  34 y/o F who was evaluated in the office by Dr. Aron and determined to have gallbladder disease. It was recommended that she undergo a cholecystectomy. She arrives today and reports that she is in her usual state of health. She denies changes in medications.  Surgical history includes c-section.  ROS: Review of Systems  Constitutional: Negative.   HENT: Negative.    Eyes: Negative.   Respiratory: Negative.    Cardiovascular: Negative.   Gastrointestinal: Negative.   Genitourinary: Negative.   Musculoskeletal: Negative.   Skin: Negative.   Neurological: Negative.   Endo/Heme/Allergies: Negative.   Psychiatric/Behavioral: Negative.      Family History  Problem Relation Age of Onset   Diabetes Mother    Asthma Mother    Hypertension Mother    Arthritis Mother    Asthma Father    Asthma Sister    Asthma Brother    Arthritis Maternal Grandmother        RA   Hypertension Maternal Grandmother    Cancer - Lung Maternal Grandfather     Past Medical History:  Diagnosis Date   Anxiety    situational   Asthma    Migraine     Past Surgical History:  Procedure Laterality Date   CESAREAN SECTION  2017   ELBOW SURGERY Right 2008   SHOULDER SURGERY Right 2009   TONSILLECTOMY      Social History:  reports that she has never smoked. She has never used smokeless tobacco. She reports that she does not drink alcohol and does not use drugs.  Allergies: Allergies[1]  Medications Prior to Admission  Medication Sig Dispense Refill   acetaminophen  (TYLENOL ) 500 MG tablet Take 500 mg by mouth every 6 (six) hours as needed for mild pain (pain score 1-3) or moderate pain (pain score 4-6).     cefpodoxime  (VANTIN ) 200 MG tablet Take 1 tablet (200 mg total) by mouth 2 (two) times daily for 12 days. 24 tablet 0   CRYSELLE-28 0.3-30 MG-MCG tablet Take 1 tablet by mouth daily.     cyanocobalamin  1000 MCG tablet Take 0.5  tablets (500 mcg total) by mouth daily. 15 tablet 0   doxycycline  (VIBRA -TABS) 100 MG tablet Take 1 tablet (100 mg total) by mouth 2 (two) times daily for 12 days. 24 tablet 0   ferrous sulfate  325 (65 FE) MG EC tablet Take 1 tablet (325 mg total) by mouth daily with breakfast. 30 tablet 1   folic acid  (FOLVITE ) 1 MG tablet Take 1 tablet (1 mg total) by mouth daily. 30 tablet 0   HYDROcodone -acetaminophen  (NORCO/VICODIN) 5-325 MG tablet Take 1-2 tablets by mouth every 4 (four) hours as needed for up to 5 days for moderate pain (pain score 4-6). 30 tablet 0   hydrOXYzine  (VISTARIL ) 25 MG capsule Take 1 capsule (25 mg total) by mouth every 8 (eight) hours as needed. 30 capsule 0   ibuprofen  (ADVIL ) 200 MG tablet Take 400 mg by mouth every 6 (six) hours as needed for mild pain (pain score 1-3) or moderate pain (pain score 4-6).     levonorgestrel (MIRENA, 52 MG,) 20 MCG/DAY IUD 1 each by Intrauterine route once.     metroNIDAZOLE  (FLAGYL ) 500 MG tablet Take 1 tablet (500 mg total) by mouth every 12 (twelve) hours for 12 days. 24 tablet 0   ondansetron  (ZOFRAN ) 4 MG tablet Take 1 tablet (4 mg total) by mouth every  8 (eight) hours as needed. 30 tablet 0   polyethylene glycol powder (GLYCOLAX /MIRALAX ) 17 GM/SCOOP powder Dissolve 1 capful (17g) in 4-8 ounces of liquid and take by mouth 2 (two) times daily. (Patient taking differently: Take 17 g by mouth 2 (two) times daily as needed for moderate constipation.) 238 g 0   albuterol  (PROVENTIL  HFA;VENTOLIN  HFA) 108 (90 Base) MCG/ACT inhaler INHALE 2 PUFFS INTO THE LUNGS EVERY 4 (FOUR) HOURS AS NEEDED FOR WHEEZING OR SHORTNESS OF BREATH. 6.7 Inhaler 0    Physical Exam: Blood pressure 107/73, pulse 76, temperature 97.8 F (36.6 C), temperature source Oral, resp. rate 17, height 5' 7 (1.702 m), weight 70.3 kg, last menstrual period 06/01/2024, SpO2 97%. Gen: female, NAD Abd: soft, non-distended  Results for orders placed or performed during the hospital  encounter of 08/25/24 (from the past 48 hours)  Pregnancy, urine POC     Status: None   Collection Time: 08/25/24  6:00 AM  Result Value Ref Range   Preg Test, Ur NEGATIVE NEGATIVE    Comment:        THE SENSITIVITY OF THIS METHODOLOGY IS >20 mIU/mL.    No results found.  Assessment/Plan 34 y/o F with symptomatic cholelithiasis   - Will proceed to the OR. We discussed the alternatives and potential risks of surgery, including but not limited to: bleeding, infection, damage to bowel or surrounding structures, bile leak, pancreatitis, retained stone, damage to the biliary system, and need for additional procedures. All questions were addressed and consent was obtained.    Cordella DELENA Polly Mar[facility] 08/25/2024, 6:58 AM Please see Amion for pager number during day hours 7:00am-4:30pm or 7:00am -11:30am on weekends     [1]  Allergies Allergen Reactions   Clotrimazole  Dermatitis    Caused blisters    Junel 1-20 [Norethindrone Acet-Ethinyl Est]     Legs became itchy   Other     Seasonal allergies   "

## 2024-08-25 NOTE — Transfer of Care (Signed)
 Immediate Anesthesia Transfer of Care Note  Patient: Erin Pratt  Procedure(s) Performed: LAPAROSCOPIC CHOLECYSTECTOMY With ICG Cholangiography DG CHOLANGIOGRAM OPERATIVE  Patient Location: PACU  Anesthesia Type:General  Level of Consciousness: awake, alert , and oriented  Airway & Oxygen Therapy: Patient Spontanous Breathing and Patient connected to nasal cannula oxygen  Post-op Assessment: Report given to RN and Post -op Vital signs reviewed and stable  Post vital signs: Reviewed and stable  Last Vitals:  Vitals Value Taken Time  BP 121/71 08/25/24 08:40  Temp 36 0840  Pulse 98 08/25/24 08:44  Resp 17 08/25/24 08:44  SpO2 99 % 08/25/24 08:44  Vitals shown include unfiled device data.  Last Pain:  Vitals:   08/25/24 0618  TempSrc:   PainSc: 3          Complications: No notable events documented.

## 2024-08-26 ENCOUNTER — Encounter (HOSPITAL_COMMUNITY): Payer: Self-pay | Admitting: General Surgery

## 2024-08-26 ENCOUNTER — Inpatient Hospital Stay: Admitting: Nurse Practitioner

## 2024-08-26 LAB — SURGICAL PATHOLOGY

## 2024-10-21 ENCOUNTER — Ambulatory Visit: Admitting: Nurse Practitioner
